# Patient Record
Sex: Female | Born: 2000 | Race: White | Hispanic: No | Marital: Single | Smoking: Never smoker
Health system: Southern US, Community
[De-identification: ages and names within clinical notes are randomized; demographics above are authoritative.]

## PROBLEM LIST (undated history)

## (undated) DIAGNOSIS — F32A Depression, unspecified: Secondary | ICD-10-CM

## (undated) DIAGNOSIS — R079 Chest pain, unspecified: Secondary | ICD-10-CM

## (undated) DIAGNOSIS — D649 Anemia, unspecified: Secondary | ICD-10-CM

## (undated) DIAGNOSIS — E282 Polycystic ovarian syndrome: Secondary | ICD-10-CM

## (undated) DIAGNOSIS — F419 Anxiety disorder, unspecified: Secondary | ICD-10-CM

## (undated) DIAGNOSIS — R7303 Prediabetes: Secondary | ICD-10-CM

## (undated) DIAGNOSIS — M549 Dorsalgia, unspecified: Secondary | ICD-10-CM

## (undated) DIAGNOSIS — R0602 Shortness of breath: Secondary | ICD-10-CM

## (undated) DIAGNOSIS — I10 Essential (primary) hypertension: Secondary | ICD-10-CM

## (undated) HISTORY — DX: Anemia, unspecified: D64.9

## (undated) HISTORY — DX: Prediabetes: R73.03

## (undated) HISTORY — DX: Essential (primary) hypertension: I10

## (undated) HISTORY — DX: Dorsalgia, unspecified: M54.9

## (undated) HISTORY — DX: Anxiety disorder, unspecified: F41.9

## (undated) HISTORY — DX: Polycystic ovarian syndrome: E28.2

## (undated) HISTORY — DX: Depression, unspecified: F32.A

## (undated) HISTORY — DX: Chest pain, unspecified: R07.9

## (undated) HISTORY — DX: Shortness of breath: R06.02

## (undated) NOTE — *Deleted (*Deleted)
I value your feedback and entrusting us with your care. If you get a [facility] patient survey, I would appreciate you taking the time to let us know about your experience today. Thank you!  As of December 12, 2018, your lab results will be released to your MyChart immediately, before I even have a chance to see them. Please give me time to review them and contact you if there are any abnormalities. Thank you for your patience.  

---
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
## 2021-06-28 ENCOUNTER — Encounter: Payer: Self-pay | Admitting: *Deleted

## 2021-06-28 ENCOUNTER — Emergency Department
Admission: EM | Admit: 2021-06-28 | Discharge: 2021-06-29 | Disposition: A | Discharge status: Home / Self Care | Payer: Medicaid Other | Attending: Emergency Medicine | Admitting: Emergency Medicine

## 2021-06-28 ENCOUNTER — Other Ambulatory Visit: Payer: Self-pay

## 2021-06-28 DIAGNOSIS — N939 Abnormal uterine and vaginal bleeding, unspecified: Secondary | ICD-10-CM | POA: Diagnosis present

## 2021-06-28 MED ORDER — IBUPROFEN 800 MG PO TABS
800.0000 mg | ORAL_TABLET | Freq: Once | ORAL | Status: AC
  Administered 2021-06-28: 800 mg via ORAL
  Filled 2021-06-28: qty 1

## 2021-06-29 LAB — POC URINE PREG, ED: Preg Test, Ur: NEGATIVE

## 2021-06-29 MED ORDER — NORGESTIMATE-ETH ESTRADIOL 0.25-35 MG-MCG PO TABS: 1.0000 | ORAL_TABLET | Freq: Every day | ORAL | 0 refills | Status: DC

## 2021-06-29 NOTE — Discharge Instructions (Addendum)
You may alternate Tylenol 1000 mg every 6 hours as needed for pain, fever and Ibuprofen 800 mg every 6-8 hours as needed for pain, fever.  Please take Ibuprofen with food.  Do not take more than 4000 mg of Tylenol (acetaminophen) in a 24 hour period.  Steps to find a Primary Care Provider (PCP):  Call [phone] or [phone] to access "[facility] Find a Doctor Service."  2.  You may also go on the [facility] website at [email or web address]  

## 2021-06-29 NOTE — ED Provider Notes (Signed)
[facility] Provider Note    Event Date/Time   First MD Initiated Contact with Patient 06/28/21 2259     (approximate)   History   Vaginal Bleeding   HPI  Sonya Knapp is a 21 y.o. female with history of irregular menstrual cycles who presents to the emergency department with vaginal bleeding that has been ongoing for about a month and a half.  States it has been heavier and more painful over the past several days and she has had to miss 2 days of work.  She states that she recently lost her insurance and so has not followed up with anybody as an outpatient for this.  She denies any fevers, vomiting, diarrhea, dysuria, vaginal discharge or odor.  She states that when she has had heavy menstrual cycles before she has been on birth control pills which has helped and is asking for a prescription today.  States she also had to miss 2 days of work due to the heavy bleeding and is requesting a work note.  She declines any other work-up including blood work, ultrasound.  She states that when she has had heavy bleeding before she has had blood work that was normal.   History provided by patient.    No past medical history on file.  No past surgical history on file.  MEDICATIONS:  Prior to Admission medications   Medication Sig Start Date End Date Taking? Authorizing Provider  amoxicillin (AMOXIL) 500 MG capsule Take 2 capsules (1,000 mg total) by mouth 3 (three) times daily. 11/08/19   Jene Every, MD    Physical Exam   Triage Vital Signs: ED Triage Vitals  Enc Vitals Group     BP 06/28/21 2100 (!) 150/77     Pulse Rate 06/28/21 2058 79     Resp 06/28/21 2058 18     Temp 06/28/21 2058 98.6 F (37 C)     Temp Source 06/28/21 2058 Oral     SpO2 06/28/21 2058 99 %     Weight 06/28/21 2056 (!) 360 lb (163.3 kg)     Height 06/28/21 2056 5\' 6"  (1.676 m)     Head Circumference --      Peak Flow --      Pain Score 06/28/21 2056 6     Pain Loc --      Pain  Edu? --      Excl. in GC? --     Most recent vital signs: Vitals:   06/28/21 2100 06/28/21 2355  BP: (!) 150/77 (!) 134/99  Pulse:  78  Resp:  16  Temp:    SpO2:  100%    CONSTITUTIONAL: Alert and oriented and responds appropriately to questions. Well-appearing; well-nourished HEAD: Normocephalic, atraumatic EYES: Conjunctivae clear, pupils appear equal, sclera nonicteric ENT: normal nose; moist mucous membranes NECK: Supple, normal ROM CARD: RRR; S1 and S2 appreciated; no murmurs, no clicks, no rubs, no gallops RESP: Normal chest excursion without splinting or tachypnea; breath sounds clear and equal bilaterally; no wheezes, no rhonchi, no rales, no hypoxia or respiratory distress, speaking full sentences ABD/GI: Normal bowel sounds; non-distended; soft, non-tender, no rebound, no guarding, no peritoneal signs BACK: The back appears normal EXT: Normal ROM in all joints; no deformity noted, no edema; no cyanosis SKIN: Normal color for age and race; warm; no rash on exposed skin NEURO: Moves all extremities equally, normal speech PSYCH: The patient's mood and manner are appropriate.   ED Results / Procedures / Treatments  LABS: (all labs ordered are listed, but only abnormal results are displayed) Labs Reviewed  POC URINE PREG, ED     EKG:   RADIOLOGY: My personal review and interpretation of imaging:    I have personally reviewed all radiology reports.   No results found.   PROCEDURES:  Critical Care performed: No  studies, pulse oximetry and re-evaluation of patient's condition.   Procedures    IMPRESSION / MDM / ASSESSMENT AND PLAN / ED COURSE  I reviewed the triage vital signs and the nursing notes.    Patient here with dysfunctional uterine bleeding.  States this is a chronic issue for her.  She has been bleeding for about a month and a half.  No systemic symptoms.  Here asking for birth control.     DIFFERENTIAL DIAGNOSIS (includes but not  limited to):   Dysfunctional uterine bleeding, uterine fibroids, endometriosis, less likely ovarian cyst, ovarian torsion, TOA, PID   Patient's presentation is most consistent with acute presentation with potential threat to life or bodily function.   PLAN: Have offered blood work today to check hemoglobin, platelets, coags to ensure she does not need transfusion or further work-up for her dysfunctional uterine bleeding but she declined stating that she has had blood work multiple times in the past when she has had heavy bleeding that she reports has been normal.  She declines pelvic exam, ultrasound.  States that she just wants to be put back on birth control and needs a work note for missing work for the past 2 days.  She denies being a smoker.  She does not have an OB/GYN for follow-up.  She agrees to a pregnancy test and ibuprofen for pain.   MEDICATIONS GIVEN IN ED: Medications  ibuprofen (ADVIL) tablet 800 mg (800 mg Oral Given 06/28/21 2354)     ED COURSE: Pregnancy test negative.  Will discharge with 1 month prescription of Sprintec but have encouraged her to establish care with a PCP, OB/GYN for further follow-up.  Discussed bleeding return precautions.   CONSULTS: No admission needed at this time.  Patient hemodynamically stable.  Refusing further work-up including labs.   OUTSIDE RECORDS REVIEWED: Reviewed patient's previous OB/GYN note with Gevena Barre on 01/22/2020.       FINAL CLINICAL IMPRESSION(S) / ED DIAGNOSES   Final diagnoses:  Abnormal uterine bleeding (AUB)     Rx / DC Orders   ED Discharge Orders          Ordered    norgestimate-ethinyl estradiol (SPRINTEC 28) 0.25-35 MG-MCG tablet  Daily        06/29/21 0011             Note:  This document was prepared using Dragon voice recognition software and may include unintentional dictation errors.   Jaivian Battaglini, Layla Maw, DO 06/29/21 0020

## 2021-12-15 IMAGING — CT CT ANGIO CHEST
2 of 6 series · 17 of 46 positions shown · IV contrast (APPLIED)
Comparison: Chest radiograph 11/05/2019

CLINICAL DATA: Pt reports intermittent sharp chest pain to the
center of her chest for the last couple of weeks. Pt reports some
SOB with the pain but denies all other sx's.

EXAM:
CT ANGIOGRAPHY CHEST WITH CONTRAST
TECHNIQUE: Multidetector CT imaging of the chest was performed using the
standard protocol during bolus administration of intravenous
contrast. Multiplanar CT image reconstructions and MIPs were
obtained to evaluate the vascular anatomy.
CONTRAST:  100mL OMNIPAQUE IOHEXOL 350 MG/ML SOLN

[Series 8: thins · axial · 0.88mm/px · z∈[-624,-356]mm · 14 of 294 slices shown]
[im 13/294  lung]
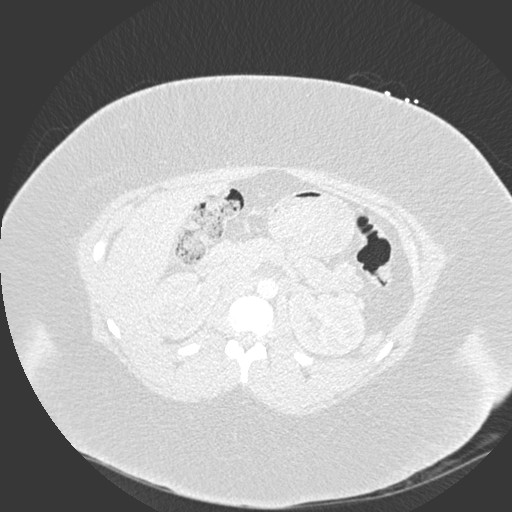
[im 39/294  soft-tissue]
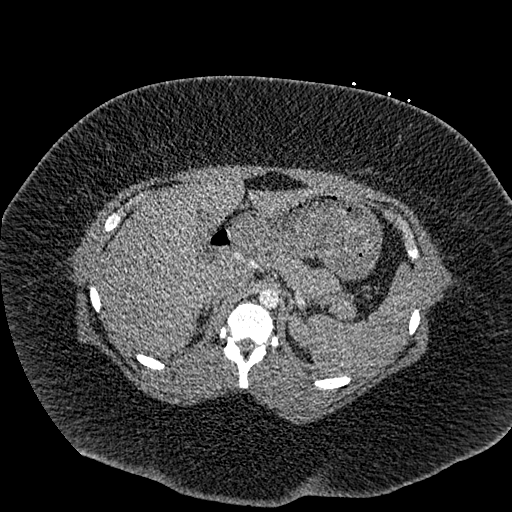
[im 51/294  lung]
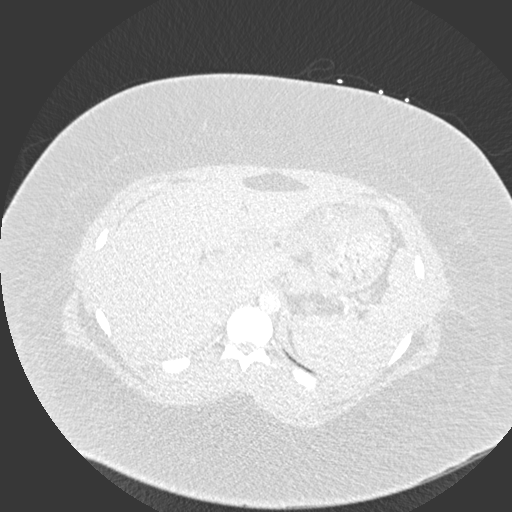
[im 77/294  soft-tissue]
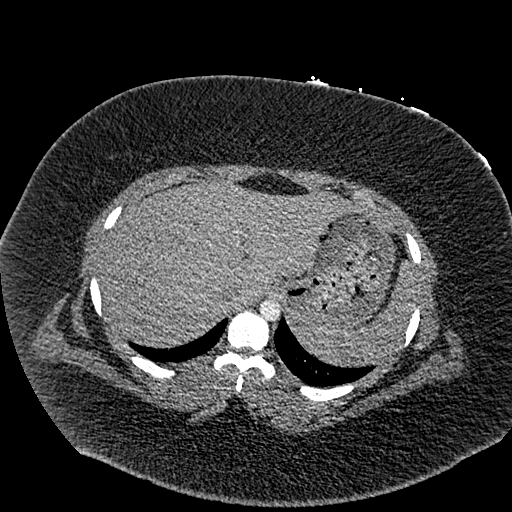
[im 102/294  lung]
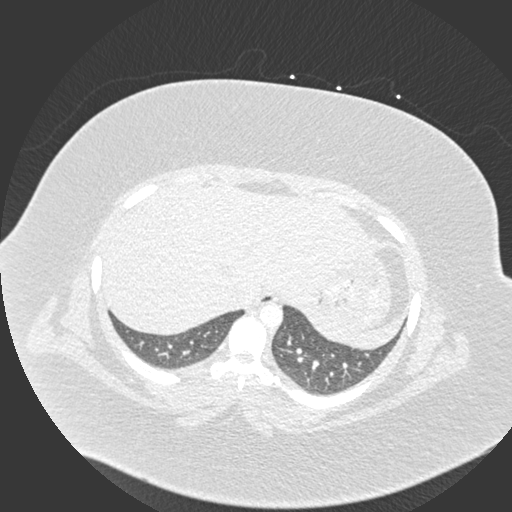
[im 115/294  soft-tissue]
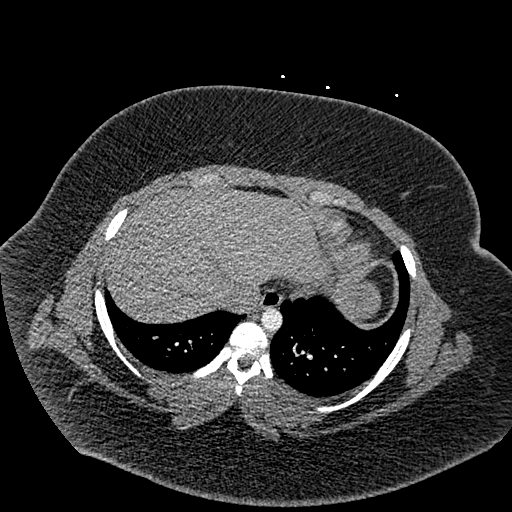
[im 141/294  lung]
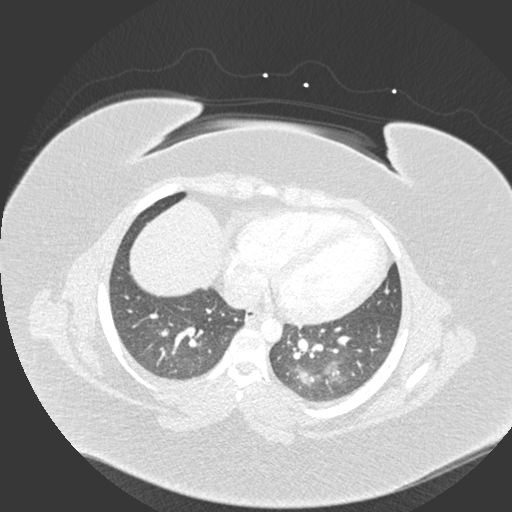
[im 153/294  soft-tissue]
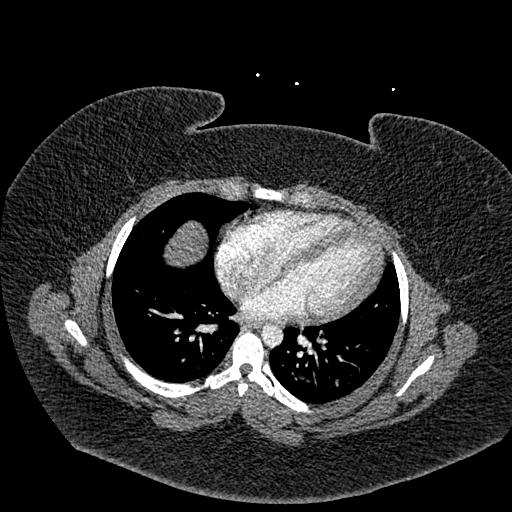
[im 179/294  lung]
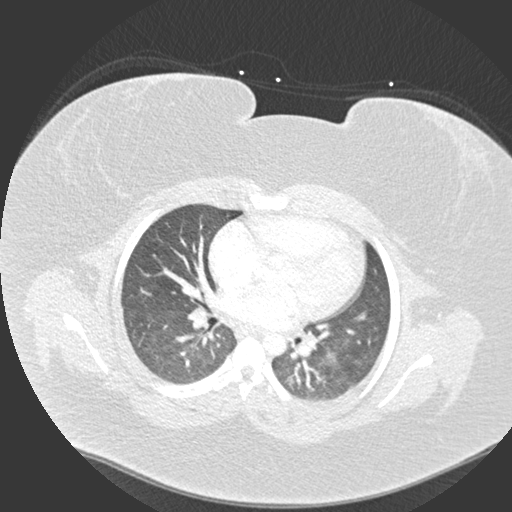
[im 192/294  soft-tissue]
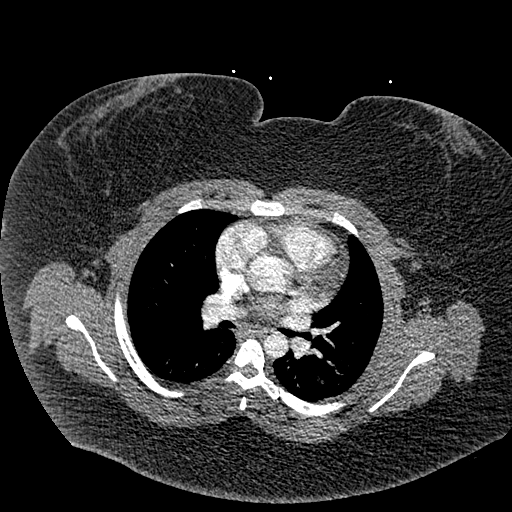
[im 217/294  lung]
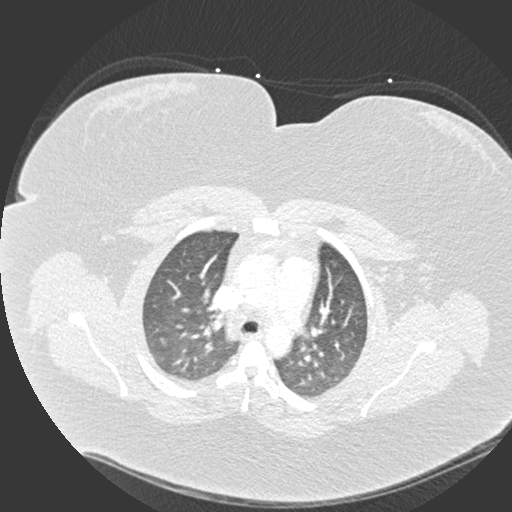
[im 243/294  soft-tissue]
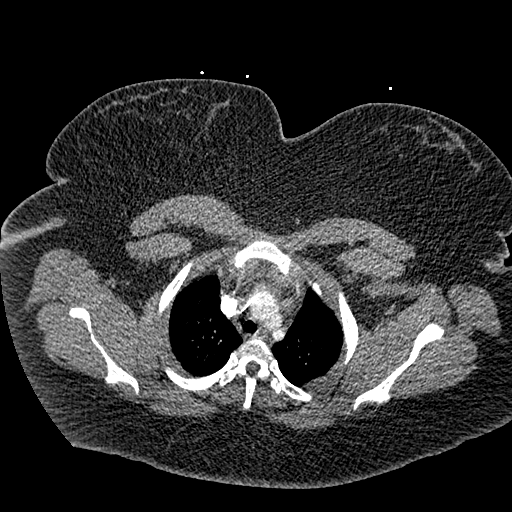
[im 255/294  lung]
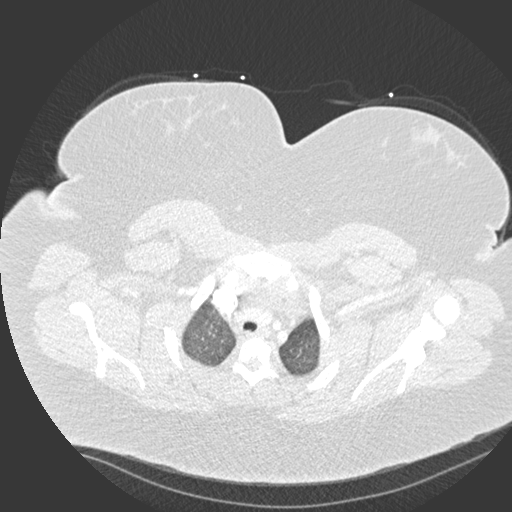
[im 281/294  soft-tissue]
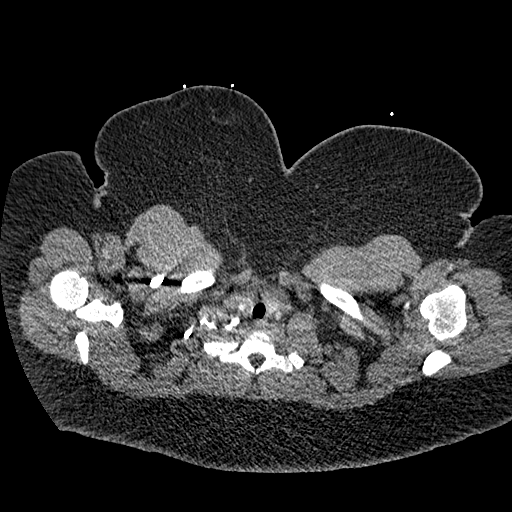

[Series 10: coronal mpr · coronal · 0.59mm/px · 3 of 115 slices shown]
[im 29/115  soft-tissue]
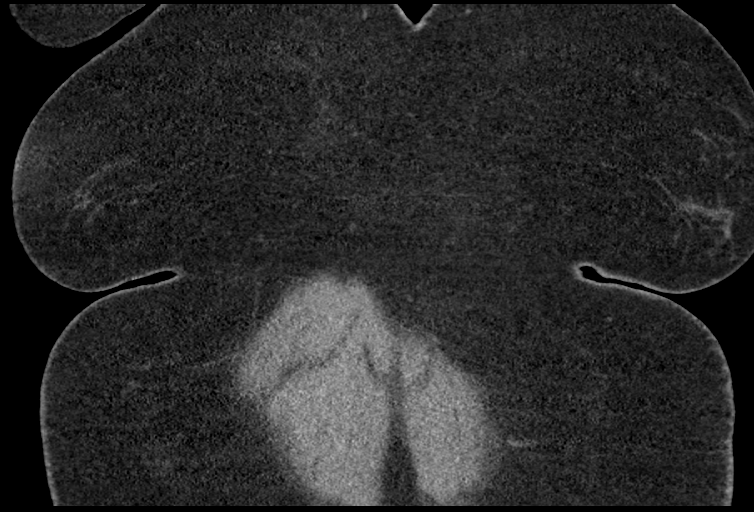
[im 58/115  soft-tissue]
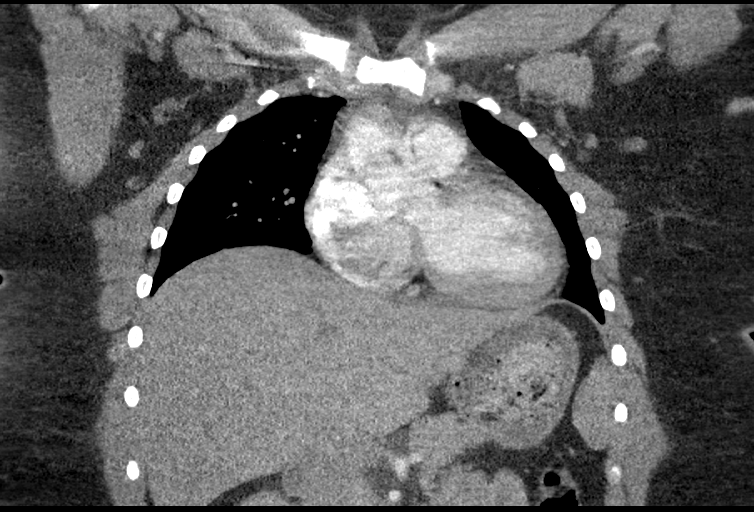
[im 86/115  soft-tissue]
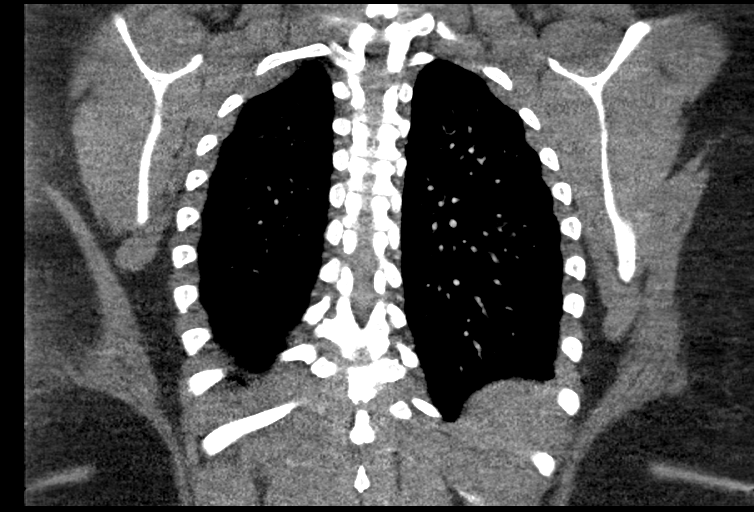

[17 of 46 positions shown; findings below may reference images not displayed]

FINDINGS: Cardiovascular: Poor contrast opacification and patient body habitus
significantly limits evaluation of the pulmonary arteries. No
definite large central thrombus. Normal heart size. No pericardial
effusion. Normal caliber of the thoracic aorta. Some

Mediastinum/Nodes: No enlarged mediastinal, hilar, or axillary lymph
nodes. Thyroid gland, trachea, and esophagus demonstrate no
significant findings.

Lungs/Pleura: Central airways are patent. There are multifocal small
clustered ground-glass opacities in the left greater than right
lower lobes. No large consolidation. No pneumothorax or pleural
effusion.

Upper Abdomen: No acute abnormality.

Musculoskeletal: No chest wall abnormality. No acute or significant
osseous findings.

Review of the MIP images confirms the above findings.
IMPRESSION: 1. Poor contrast opacification and patient body habitus
significantly limits evaluation of the pulmonary arteries. No
definite large central thrombus.
2. Multifocal clustered ground-glass opacities in the left greater
than right lower lobes, suspicious for multifocal infectious versus
inflammatory process.

## 2022-07-03 ENCOUNTER — Ambulatory Visit (INDEPENDENT_AMBULATORY_CARE_PROVIDER_SITE_OTHER): Payer: 59 | Admitting: Family Medicine

## 2022-07-03 ENCOUNTER — Encounter (INDEPENDENT_AMBULATORY_CARE_PROVIDER_SITE_OTHER): Payer: Self-pay | Admitting: Family Medicine

## 2022-07-03 VITALS — BP 124/82 | HR 80 | Temp 98.1°F | Ht 66.0 in | Wt 384.0 lb

## 2022-07-03 DIAGNOSIS — R7303 Prediabetes: Secondary | ICD-10-CM | POA: Diagnosis not present

## 2022-07-03 DIAGNOSIS — Z0289 Encounter for other administrative examinations: Secondary | ICD-10-CM

## 2022-07-03 DIAGNOSIS — E669 Obesity, unspecified: Secondary | ICD-10-CM

## 2022-07-03 DIAGNOSIS — Z6841 Body Mass Index (BMI) 40.0 and over, adult: Secondary | ICD-10-CM | POA: Insufficient documentation

## 2022-07-03 NOTE — Progress Notes (Unsigned)
Office: [phone]  /  Fax: [phone]   Initial Visit  Sonya Knapp was seen in clinic today to evaluate for obesity. She is interested in losing weight to improve overall health and reduce the risk of weight related complications. She presents today to review program treatment options, initial physical assessment, and evaluation.     She was referred by: Self-Referral  When asked what else they would like to accomplish? She states: Adopt healthier eating patterns and Improve quality of life  When asked how has your weight affected you? She states: Contributed to medical problems  Some associated conditions: Prediabetes, GERD, and PCOS  Current nutrition plan: None and Other: decrease soda  Current or previous pharmacotherapy: Metformin  Response to medication: Had side effects so it was discontinued   Past medical history includes:  History reviewed. No pertinent past medical history.   Objective:   BP 124/82   Pulse 80   Temp 98.1 F (36.7 C)   Ht 5\' 6"  (1.676 m)   Wt (!) 384 lb (174.2 kg)   SpO2 97%   BMI 61.98 kg/m  She was weighed on the bioimpedance scale: Body mass index is 61.98 kg/m.  Peak Weight: 380 lbs ,Visceral Fat Rating:21, Body Fat%:54.6  General:  Alert, oriented and cooperative. Patient is in no acute distress.  Respiratory: Normal respiratory effort, no problems with respiration noted  Extremities: Normal range of motion.    Mental Status: Normal mood and affect. Normal behavior. Normal judgment and thought content.   Assessment and Plan:  1. Prediabetes Patient has an elevated A1c above 6.0. She had GI upset with metformin in the past.  Plan: Patient agreed to start weight loss to help decrease the risk of diabetes mellitus. We will schedule her for fasting labs and additional testing.   2. Obesity, Beginning BMI 62.0  3. Class 3 severe obesity with serious comorbidity and body mass index (BMI) of 60.0 to 69.9 in adult, unspecified obesity  type (HCC) We reviewed weight, biometrics, associated medical conditions and contributing factors with patient. She would benefit from weight loss therapy via a modified calorie, low-carb, high-protein nutritional plan tailored to their REE (resting energy expenditure) which will be determined by indirect calorimetry.  We will also assess for cardiometabolic risk and nutritional derangements via fasting serologies at her next appointment.      Obesity Treatment / Action Plan:  Patient will work on garnering support from family and friends to begin weight loss journey. Will complete provided nutritional and psychosocial assessment questionnaire before the next appointment. Will be scheduled for indirect calorimetry to determine resting energy expenditure in a fasting state.  This will allow [place] to create a reduced calorie, high-protein meal plan to promote loss of fat mass while preserving muscle mass. Will reduce liquid calories and sugary drinks from diet. Was counseled on nutritional approaches to weight loss and benefits of reducing processed foods and consuming plant-based foods and high quality protein as part of nutritional weight management.  Obesity Education Performed Today:  She was weighed on the bioimpedance scale and results were discussed and documented in the synopsis.  We discussed obesity as a disease and the importance of a more detailed evaluation of all the factors contributing to the disease.  We discussed the importance of long term lifestyle changes which include nutrition, exercise and behavioral modifications as well as the importance of customizing this to her specific health and social needs.  We discussed the benefits of reaching a healthier weight to  alleviate the symptoms of existing conditions and reduce the risks of the biomechanical, metabolic and psychological effects of obesity.  Sonya Knapp appears to be in the action stage of change and states they are ready  to start intensive lifestyle modifications and behavioral modifications.  I have personally spent 45 minutes total time today in preparation, patient care, and documentation for this visit, including the following: review of clinical lab tests; review of medical tests/procedures/services.  Reviewed by clinician on day of visit: allergies, medications, problem list, medical history, surgical history, family history, social history, and previous encounter notes.   I, Burt Knack, am acting as transcriptionist for Quillian Quince, MD   I have reviewed the above documentation for accuracy and completeness, and I agree with the above. Quillian Quince, MD

## 2022-07-17 ENCOUNTER — Encounter (INDEPENDENT_AMBULATORY_CARE_PROVIDER_SITE_OTHER): Payer: Self-pay | Admitting: Family Medicine

## 2022-07-17 ENCOUNTER — Ambulatory Visit (INDEPENDENT_AMBULATORY_CARE_PROVIDER_SITE_OTHER): Payer: 59 | Admitting: Family Medicine

## 2022-07-17 VITALS — BP 121/79 | HR 79 | Temp 97.9°F | Ht 66.0 in | Wt 388.0 lb

## 2022-07-17 DIAGNOSIS — D6489 Other specified anemias: Secondary | ICD-10-CM | POA: Diagnosis not present

## 2022-07-17 DIAGNOSIS — R7303 Prediabetes: Secondary | ICD-10-CM | POA: Diagnosis not present

## 2022-07-17 DIAGNOSIS — R5383 Other fatigue: Secondary | ICD-10-CM | POA: Diagnosis not present

## 2022-07-17 DIAGNOSIS — F32A Depression, unspecified: Secondary | ICD-10-CM

## 2022-07-17 DIAGNOSIS — E669 Obesity, unspecified: Secondary | ICD-10-CM

## 2022-07-17 DIAGNOSIS — R0602 Shortness of breath: Secondary | ICD-10-CM

## 2022-07-17 DIAGNOSIS — Z6841 Body Mass Index (BMI) 40.0 and over, adult: Secondary | ICD-10-CM

## 2022-07-17 DIAGNOSIS — D649 Anemia, unspecified: Secondary | ICD-10-CM | POA: Insufficient documentation

## 2022-07-17 DIAGNOSIS — Z1331 Encounter for screening for depression: Secondary | ICD-10-CM | POA: Insufficient documentation

## 2022-07-18 NOTE — Progress Notes (Signed)
Chief Complaint:   OBESITY Sonya Knapp (MR# [id number]) is a 22 y.o. female who presents for evaluation and treatment of obesity and related comorbidities. Current BMI is Body mass index is 62.62 kg/m. Sonya Knapp has been struggling with her weight for many years and has been unsuccessful in either losing weight, maintaining weight loss, or reaching her healthy weight goal.  Sonya Knapp is currently in the action stage of change and ready to dedicate time achieving and maintaining a healthier weight. Sonya Knapp is interested in becoming our patient and working on intensive lifestyle modifications including (but not limited to) diet and exercise for weight loss.  Sonya Knapp's habits were reviewed today and are as follows: her desired weight loss is 188 lbs, she has been heavy most of her life, she started gaining weight over COVID, her heaviest weight ever was 381 pounds, she has significant food cravings issues, she snacks frequently in the evenings, she skips meals frequently, she is frequently drinking liquids with calories, she frequently makes poor food choices, she has problems with excessive hunger, she frequently eats larger portions than normal, she has binge eating behaviors, and she struggles with emotional eating.  Depression Screen Bruchy's Food and Mood (modified PHQ-9) score was 26.  Subjective:   1. Other fatigue Sonya Knapp admits to daytime somnolence and admits to waking up still tired. Patient has a history of symptoms of daytime fatigue, morning fatigue, and morning headache. Sonya Knapp generally gets 6 or 8 hours of sleep per night, and states that she has nightime awakenings and generally restful sleep. Snoring is present. Apneic episodes are not present. Epworth Sleepiness Score is 8.   2. SOBOE (shortness of breath on exertion) Sonya Knapp notes increasing shortness of breath with exercising and seems to be worsening over time with weight gain. She notes getting out of breath sooner with activity than  she used to. This has not gotten worse recently. Sonya Knapp denies shortness of breath at rest or orthopnea.  3. Prediabetes Patient has a history of elevated glucose and A1c.  She is ready to work on her diet to help prevent diabetes mellitus.  4. Anemia due to other cause, not classified Patient's diagnosis is due to heavy menses. She is not on supplementation and notes fatigue.  5. Depression screening Patient notes significant emotional eating behavior and she feels she has both anxiety and depression.  She denies suicidal ideations.  Assessment/Plan:   1. Other fatigue Sonya Knapp does feel that her weight is causing her energy to be lower than it should be. Fatigue may be related to obesity, depression or many other causes. Labs will be ordered, and in the meanwhile, Sonya Knapp will focus on self care including making healthy food choices, increasing physical activity and focusing on stress reduction.  - EKG 12-Lead - CBC with Differential/Platelet - CMP14+EGFR - Lipid Panel With LDL/HDL Ratio - TSH - VITAMIN D 25 Hydroxy (Vit-D Deficiency, Fractures) - Folate  2. SOBOE (shortness of breath on exertion) Sonya Knapp does feel that she gets out of breath more easily that she used to when she exercises. Sonya Knapp's shortness of breath appears to be obesity related and exercise induced. She has agreed to work on weight loss and gradually increase exercise to treat her exercise induced shortness of breath. Will continue to monitor closely.  - EKG 12-Lead  3. Prediabetes We will check labs today.  Patient will start on her category 3 eating plan, and we will follow-up at her next visit.  - Insulin, random - Hemoglobin  A1c  4. Anemia due to other cause, not classified We will check labs today, and we will follow-up at patient's next visit.  - Folate - Iron and TIBC - Ferritin  5. Depression screening Sonya Knapp had a positive depression screening. Depression is commonly associated with obesity and often  results in emotional eating behaviors. We will monitor this closely and work on CBT to help improve the non-hunger eating patterns. Referral to [name], our bariatric psychologist for evaluation, and patient will work on decreasing emotional eating behavior.  6. BMI 60.0-69.9, adult (HCC)  7. Obesity, Beginning BMI 62.0 [name] is currently in the action stage of change and her goal is to continue with weight loss efforts. I recommend [name] begin the structured treatment plan as follows:  She has agreed to the Category 3 Plan.  Exercise goals: No exercise has been prescribed for now, while we concentrate on nutritional changes.   Behavioral modification strategies: decreasing eating out, better snacking choices, and dealing with family or coworker sabotage.  She was informed of the importance of frequent follow-up visits to maximize her success with intensive lifestyle modifications for her multiple health conditions. She was informed we would discuss her lab results at her next visit unless there is a critical issue that needs to be addressed sooner. Sonya Knapp agreed to keep her next visit at the agreed upon time to discuss these results.  Objective:   Blood pressure 121/79, pulse 79, temperature 97.9 F (36.6 C), height 5\' 6"  (1.676 m), weight (!) 388 lb (176 kg), SpO2 99%. Body mass index is 62.62 kg/m.  EKG: Normal sinus rhythm, rate 79 BPM.  Indirect Calorimeter completed today shows a VO2 of 329 and a REE of 2275.  Her calculated basal metabolic rate is 8469 thus her basal metabolic rate is worse than expected.  General: Cooperative, alert, well developed, in no acute distress. HEENT: Conjunctivae and lids unremarkable. Cardiovascular: Regular rhythm.  Lungs: Normal work of breathing. Neurologic: No focal deficits.   Lab Results  Component Value Date   CREATININE 0.81 11/08/2019   BUN 11 11/08/2019   NA 140 11/08/2019   K 3.7 11/08/2019   CL 106 11/08/2019   CO2 26 11/08/2019    No results found for: "ALT", "AST", "GGT", "ALKPHOS", "BILITOT" No results found for: "HGBA1C" No results found for: "INSULIN" No results found for: "TSH" No results found for: "CHOL", "HDL", "LDLCALC", "LDLDIRECT", "TRIG", "CHOLHDL" Lab Results  Component Value Date   WBC 6.5 11/08/2019   HGB 8.1 (L) 11/08/2019   HCT 28.2 (L) 11/08/2019   MCV 66.5 (L) 11/08/2019   PLT 466 (H) 11/08/2019   No results found for: "IRON", "TIBC", "FERRITIN"  Attestation Statements:   Reviewed by clinician on day of visit: allergies, medications, problem list, medical history, surgical history, family history, social history, and previous encounter notes.  Time spent on visit including pre-visit chart review and post-visit charting and care was 45 minutes.   I, Burt Knack, am acting as transcriptionist for Quillian Quince, MD.  I have reviewed the above documentation for accuracy and completeness, and I agree with the above. - Quillian Quince, MD

## 2022-07-31 ENCOUNTER — Ambulatory Visit (INDEPENDENT_AMBULATORY_CARE_PROVIDER_SITE_OTHER): Payer: 59 | Admitting: Family Medicine

## 2022-08-09 DIAGNOSIS — R5383 Other fatigue: Secondary | ICD-10-CM | POA: Diagnosis not present

## 2022-08-15 ENCOUNTER — Telehealth (INDEPENDENT_AMBULATORY_CARE_PROVIDER_SITE_OTHER): Payer: 59 | Admitting: Psychology

## 2022-08-15 DIAGNOSIS — F411 Generalized anxiety disorder: Secondary | ICD-10-CM | POA: Diagnosis not present

## 2022-08-15 DIAGNOSIS — F5089 Other specified eating disorder: Secondary | ICD-10-CM

## 2022-08-15 DIAGNOSIS — F331 Major depressive disorder, recurrent, moderate: Secondary | ICD-10-CM
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
## 2022-08-28 ENCOUNTER — Encounter (INDEPENDENT_AMBULATORY_CARE_PROVIDER_SITE_OTHER): Payer: Self-pay | Admitting: Family Medicine

## 2022-08-28 ENCOUNTER — Ambulatory Visit (INDEPENDENT_AMBULATORY_CARE_PROVIDER_SITE_OTHER): Payer: 59 | Admitting: Family Medicine

## 2022-08-28 VITALS — BP 122/80 | HR 80 | Temp 97.8°F | Ht 66.0 in | Wt 391.0 lb

## 2022-08-28 DIAGNOSIS — R7303 Prediabetes: Secondary | ICD-10-CM

## 2022-08-28 DIAGNOSIS — E669 Obesity, unspecified: Secondary | ICD-10-CM

## 2022-08-28 DIAGNOSIS — Z6841 Body Mass Index (BMI) 40.0 and over, adult: Secondary | ICD-10-CM | POA: Diagnosis not present

## 2022-08-28 DIAGNOSIS — E559 Vitamin D deficiency, unspecified: Secondary | ICD-10-CM

## 2022-08-28 MED ORDER — VITAMIN D (ERGOCALCIFEROL) 1.25 MG (50000 UNIT) PO CAPS: 50000.0000 [IU] | ORAL_CAPSULE | ORAL | 0 refills | Status: DC

## 2022-08-28 MED ORDER — METFORMIN HCL 500 MG PO TABS
500.0000 mg | ORAL_TABLET | Freq: Every day | ORAL | 0 refills | Status: DC
Start: 2022-08-28 — End: 2022-09-18

## 2022-08-29 NOTE — Progress Notes (Unsigned)
Chief Complaint:   OBESITY Sonya Knapp is here to discuss her progress with her obesity treatment plan along with follow-up of her obesity related diagnoses. Sonya Knapp is on the Category 3 Plan and states she is following her eating plan approximately 25% of the time. Sonya Knapp states she has done some walking.  Today's visit was #: 2 Starting weight: 388 lbs Starting date: 07/17/2022 Today's weight: 391 lbs Today's date: 08/28/2022 Total lbs lost to date: 0 Total lbs lost since last in-office visit: 0  Interim History: Patient's last visit was approximately 6 weeks ago and she has not been able to follow her plan closely.  She is ready to restart and she is open to other eating plans.  Subjective:   1. Prediabetes Patient has an elevated A1c and very elevated fasting insulin.  She notes polyphagia. I discussed labs with the patient today.  2. Vitamin D deficiency Patient's vitamin D level is below goal and her Ca+ is also low, and she notes fatigue.  I discussed labs with the patient today.  Assessment/Plan:   1. Prediabetes Patient agreed to start metformin 500 mg every morning with food, with no refills.  We will follow-up at her next visit in 2 weeks.  - metFORMIN (GLUCOPHAGE) 500 MG tablet; Take 1 tablet (500 mg total) by mouth daily with breakfast.  Dispense: 30 tablet; Refill: 0  2. Vitamin D deficiency Patient agreed to start prescription vitamin D 50,000 IU once weekly with no refills.  - Vitamin D, Ergocalciferol, (DRISDOL) 1.25 MG (50000 UNIT) CAPS capsule; Take 1 capsule (50,000 Units total) by mouth every 7 (seven) days.  Dispense: 5 capsule; Refill: 0  3. BMI 60.0-69.9, adult (HCC)  4. Obesity, Beginning BMI 62.0 Sonya Knapp is currently in the action stage of change. As such, her goal is to continue with weight loss efforts. She has agreed to keeping a food journal and adhering to recommended goals of 1600-1800 calories and 100+ grams of protein daily.   Behavioral  modification strategies: increasing lean protein intake, decreasing simple carbohydrates, and keeping a strict food journal.  Sonya Knapp has agreed to follow-up with our clinic in 2 weeks. She was informed of the importance of frequent follow-up visits to maximize her success with intensive lifestyle modifications for her multiple health conditions.   Objective:   Blood pressure 122/80, pulse 80, temperature 97.8 F (36.6 C), height 5\' 6"  (1.676 m), weight (!) 391 lb (177.4 kg), SpO2 98%. Body mass index is 63.11 kg/m.  Lab Results  Component Value Date   CREATININE 0.82 08/09/2022   BUN 12 08/09/2022   NA 137 08/09/2022   K 4.4 08/09/2022   CL 102 08/09/2022   CO2 19 (L) 08/09/2022   Lab Results  Component Value Date   ALT 19 08/09/2022   AST 13 08/09/2022   ALKPHOS 83 08/09/2022   BILITOT 0.2 08/09/2022   Lab Results  Component Value Date   HGBA1C 6.1 (H) 08/09/2022   Lab Results  Component Value Date   INSULIN 106.0 (H) 08/09/2022   Lab Results  Component Value Date   TSH 3.890 08/09/2022   Lab Results  Component Value Date   CHOL 156 08/09/2022   HDL 53 08/09/2022   LDLCALC 81 08/09/2022   TRIG 127 08/09/2022   Lab Results  Component Value Date   VD25OH 28.0 (L) 08/09/2022   Lab Results  Component Value Date   WBC 6.4 08/09/2022   HGB 10.6 (L) 08/09/2022   HCT 34.7  08/09/2022   MCV 70 (L) 08/09/2022   PLT 413 08/09/2022   Lab Results  Component Value Date   IRON 105 08/09/2022   TIBC 448 08/09/2022   FERRITIN 19 08/09/2022   Attestation Statements:   Reviewed by clinician on day of visit: allergies, medications, problem list, medical history, surgical history, family history, social history, and previous encounter notes.  Time spent on visit including pre-visit chart review and post-visit care and charting was 40 minutes.   I, Burt Knack, am acting as transcriptionist for Quillian Quince, MD.  I have reviewed the above documentation for accuracy  and completeness, and I agree with the above. -  Quillian Quince, MD

## 2022-09-11 ENCOUNTER — Telehealth (INDEPENDENT_AMBULATORY_CARE_PROVIDER_SITE_OTHER): Payer: 59 | Admitting: Psychology

## 2022-09-11 DIAGNOSIS — F331 Major depressive disorder, recurrent, moderate: Secondary | ICD-10-CM

## 2022-09-11 DIAGNOSIS — F5089 Other specified eating disorder: Secondary | ICD-10-CM | POA: Diagnosis not present

## 2022-09-11 DIAGNOSIS — F411 Generalized anxiety disorder: Secondary | ICD-10-CM | POA: Diagnosis not present

## 2022-09-11 NOTE — Progress Notes (Signed)
  Office: [phone]  /  Fax: [phone]    Date: September 11, 2022  Appointment Start Time: 8:31am Duration: 25 minutes Provider: Lawerance Cruel, Psy.D. Type of Session: Individual Therapy  Location of Patient: Home (private location) Location of Provider: Provider's Home (private office) Type of Contact: Telepsychological Visit via MyChart Video Visit  Session Content: Sonya Knapp is a 22 y.o. female presenting for a follow-up appointment to address the previously established treatment goal of increasing coping skills.Today's appointment was a telepsychological visit. [name] provided verbal consent for today's telepsychological appointment and she is aware she is responsible for securing confidentiality on her end of the session. Prior to proceeding with today's appointment, [name]'s physical location at the time of this appointment was obtained as well a phone number she could be reached at in the event of technical difficulties. [name] and this provider participated in today's telepsychological service.   This provider conducted a brief check-in. [name] stated, "Things have been the same." She explained she did not reach out to any shared referral options due to feeling ambivalent. Further explored and processed. She agreed to call [facility] today. Regarding metformin, she noted she is taking it daily now. Positive reinforcement was provided. Reviewed recent hunger patterns. Psychoeducation regarding triggers for emotional eating was provided. [name] was provided a handout, and encouraged to utilize the handout between now and the next appointment to increase awareness of triggers and frequency. [name] agreed. This provider also discussed behavioral strategies for specific triggers, such as placing the utensil down when conversing to avoid mindless eating. Fumiko provided verbal consent during today's appointment for this provider to send a handout about triggers via e-mail. Overall, [name]  was receptive to today's appointment as evidenced by openness to sharing, responsiveness to feedback, and willingness to explore triggers for emotional eating.  Mental Status Examination:  Appearance: neat Behavior: appropriate to circumstances Mood: neutral Affect: mood congruent Speech: WNL Eye Contact: appropriate Psychomotor Activity: WNL Gait: unable to assess Thought Process: linear, logical, and goal directed and no evidence or endorsement of suicidal, homicidal, and self-harm ideation, plan and intent  Thought Content/Perception: no hallucinations, delusions, bizarre thinking or behavior endorsed or observed Orientation: AAOx4 Memory/Concentration: memory, attention, language, and fund of knowledge intact  Insight: fair Judgment: fair  Interventions:  Conducted a brief chart review Provided empathic reflections and validation Reviewed content from the previous session Provided positive reinforcement Employed supportive psychotherapy interventions to facilitate reduced distress and to improve coping skills with identified stressors Psychoeducation provided regarding triggers for emotional eating behaviors  DSM-5 Diagnosis(es):  F50.89 Other Specified Feeding or Eating Disorder, Emotional and Binge Eating Behaviors, F41.1 Generalized Anxiety Disorder, and F33.1  Major Depressive Disorder, Recurrent Episode, Moderate  Treatment Goal & Progress: During the initial appointment with this provider, the following treatment goal was established: increase coping skills. Progress is limited, as [name] has just begun treatment with this provider; however, she is receptive to the interaction and interventions and rapport is being established.   Plan: The next appointment is scheduled for 10/09/2022 at 8:30am, which will be via MyChart Video Visit. The next session will focus on working towards the established treatment goal. [name] will call [facility] to establish care. Phone  number was provided again during today's appointment.

## 2022-09-12 ENCOUNTER — Ambulatory Visit (INDEPENDENT_AMBULATORY_CARE_PROVIDER_SITE_OTHER): Payer: 59 | Admitting: Family Medicine

## 2022-09-18 ENCOUNTER — Ambulatory Visit (INDEPENDENT_AMBULATORY_CARE_PROVIDER_SITE_OTHER): Payer: 59 | Admitting: Family Medicine

## 2022-09-18 ENCOUNTER — Encounter (INDEPENDENT_AMBULATORY_CARE_PROVIDER_SITE_OTHER): Payer: Self-pay | Admitting: Family Medicine

## 2022-09-18 VITALS — BP 136/80 | HR 89 | Temp 97.9°F | Ht 66.0 in | Wt 395.0 lb

## 2022-09-18 DIAGNOSIS — E559 Vitamin D deficiency, unspecified: Secondary | ICD-10-CM

## 2022-09-18 DIAGNOSIS — Z6841 Body Mass Index (BMI) 40.0 and over, adult: Secondary | ICD-10-CM

## 2022-09-18 DIAGNOSIS — D6489 Other specified anemias: Secondary | ICD-10-CM | POA: Diagnosis not present

## 2022-09-18 DIAGNOSIS — R7303 Prediabetes: Secondary | ICD-10-CM

## 2022-09-18 DIAGNOSIS — D508 Other iron deficiency anemias: Secondary | ICD-10-CM

## 2022-09-18 MED ORDER — METFORMIN HCL 500 MG PO TABS
500.0000 mg | ORAL_TABLET | Freq: Two times a day (BID) | ORAL | 0 refills | Status: DC
Start: 2022-09-18 — End: 2023-10-22

## 2022-09-18 MED ORDER — IRON (FERROUS SULFATE) 325 (65 FE) MG PO TABS: ORAL_TABLET | ORAL | Status: DC

## 2022-09-18 MED ORDER — VITAMIN D (ERGOCALCIFEROL) 1.25 MG (50000 UNIT) PO CAPS
50000.0000 [IU] | ORAL_CAPSULE | ORAL | 0 refills | Status: DC
Start: 2022-09-18 — End: 2022-10-11

## 2022-09-18 NOTE — Progress Notes (Signed)
Sonya Knapp, D.O.  ABFM, ABOM Specializing in Clinical Bariatric Medicine  Office located at: [address] [place], [place]  [address]     Assessment and Plan:   Medications Discontinued During This Encounter  Medication Reason   metFORMIN (GLUCOPHAGE) 500 MG tablet Duplicate   metFORMIN (GLUCOPHAGE) 500 MG tablet Reorder   Vitamin D, Ergocalciferol, (DRISDOL) 1.25 MG (50000 UNIT) CAPS capsule Reorder    Meds ordered this encounter  Medications   Vitamin D, Ergocalciferol, (DRISDOL) 1.25 MG (50000 UNIT) CAPS capsule    Sig: Take 1 capsule (50,000 Units total) by mouth every 7 (seven) days.    Dispense:  5 capsule    Refill:  0   metFORMIN (GLUCOPHAGE) 500 MG tablet    Sig: Take 1 tablet (500 mg total) by mouth 2 (two) times daily with a meal. Lunch and dinner.    Dispense:  60 tablet    Refill:  0   Iron, Ferrous Sulfate, 325 (65 Fe) MG TABS    Sig: Pt takes 3 tabs QD   Anemia due to other cause, not classified Assessment: Condition is Not at goal.. Her hemoglobin is very low at 10.6. Her family has a history of anemia to the point where they have a partial hysterectomy by their mid 48's. Was seen at [facility] in the past when she was younger and was told that they had a genetic abnormality that causes anemia and is treated with oral iron. She takes oral iron supplement 65mg  of elemental iron TIB.  Lab Results  Component Value Date   WBC 6.4 08/09/2022   HGB 10.6 (L) 08/09/2022   HCT 34.7 08/09/2022   MCV 70 (L) 08/09/2022   PLT 413 08/09/2022      Component Value Date/Time   IRON 105 08/09/2022 1510   TIBC 448 08/09/2022 1510   FERRITIN 19 08/09/2022 1510   IRONPCTSAT 23 08/09/2022 1510   Plan: - Continue with Ferrous Sulfate as directed. I will refill this today.   - Her anemia appears to be microcytic hypochromic with a elevation in RDW which seems to be consistent with iron deficiency anemia.    Vitamin D deficiency Assessment: Condition is Not at goal..  She has been taking ERGO inconsistently for 3 weeks now. She denies any adverse side effects.  Lab Results  Component Value Date   VD25OH 28.0 (L) 08/09/2022   Plan:- Continue Ergocalciferol 50K IU weekly. I will refill this today.   - I discussed the importance of vitamin D to the patient's health and well-being as well as to their ability to lose weight.    - Informed patient this may be a lifelong thing, and she was encouraged to continue to take the medicine until told otherwise.     - weight loss will likely improve availability of vitamin D, thus encouraged Sonya Knapp to continue with meal plan and their weight loss efforts to further improve this condition.  Thus, we will need to monitor levels regularly (every 3-4 mo on average) to keep levels within normal limits and prevent over supplementation.   Prediabetes Assessment: Condition is Not at goal.. Her A1c and insulin levels are extremely elevated. She has been taking Metformin once daily for the past two weeks. She endorses no change on this and states that she "just takes it to take it". She denies any GI upset or N/V/D.  Lab Results  Component Value Date   HGBA1C 6.1 (H) 08/09/2022   INSULIN 106.0 (H) 08/09/2022  Plan: -  Increase her Metformin from once to twice daily with a meal.   - In addition, we discussed the risks and benefits of various medication options which can help [place] in the management of this disease process as well as with weight loss. However, I explained the risks of starting a GLP-1 without having the stability and habit of making healthy eating choices.  - Continue to decrease simple carbs/ sugars; increase fiber and proteins -> follow her meal plan.    - Explained role of simple carbs and insulin levels on hunger and cravings  - Anticipatory guidance given.    - We will recheck A1c and fasting insulin level in approximately 3 months from last check, or as deemed appropriate.    TREATMENT PLAN FOR  OBESITY: BMI 60.0-69.9, adult (HCC)- Current BMI-63.14 Class 3 severe obesity with serious comorbidity and body mass index (BMI) of 60.0 to 69.9 in adult, unspecified obesity type [facility]) Assessment:  Sonya Knapp is here to discuss her progress with her obesity treatment plan along with follow-up of her obesity related diagnoses. See Medical Weight Management Flowsheet for complete bioelectrical impedance results.  Condition is not optimized. Biometric data collected today, was reviewed with patient.   Since last office visit on 08/28/2022 patient's  Muscle mass has increased by 2.6lb. Fat mass has increased by 1lb. Total body water has been the same. Counseling done on how various foods will affect these numbers and how to maximize success  Total lbs lost to date: +7 Total weight loss percentage to date: +1.80   Plan: - the keeping a food journal and adhering to recommended goals of 1650-1750 calories and 120+ protein  - I advised patient to journal her daily intake so we distinguish what her good and bad eating habits are to be better assist her healthy weight journey. Looking at what she eats within a 2 week period will help [place] pinpoint which direction to place her in. I explained the purpose of GLP-1's and the risks of starting it.   - I recommended myfitnesspal, mynetdiary, and myfitnesspal as apps to use to track.   - I advised that chicken, beef, and lean red meats are the best options to eat.   - Discuss with Dr. Dalbert Garnet about pt work schedule.   Behavioral Intervention Additional resources provided today:  food log Evidence-based interventions for health behavior change were utilized today including the discussion of self monitoring techniques, problem-solving barriers and SMART goal setting techniques.   Regarding patient's less desirable eating habits and patterns, we employed the technique of small changes.  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     She has agreed to Think about ways to increase daily physical activity and overcoming barriers to exercise   FOLLOW UP: Return in about 3 weeks (around 10/09/2022).  She was informed of the importance of frequent follow up visits to maximize her success with intensive lifestyle modifications for her multiple health conditions.  Subjective:   Chief complaint: Obesity Sonya Knapp is here to discuss her progress with her obesity treatment plan. She is on the keeping a food journal and adhering to recommended goals of 1600-1800 calories and 100+ protein and states she is following her eating plan approximately 25% of the time. She states she is walking 10,000 steps 6 days per week.  Interval History:  Sonya Knapp is here for a follow up office visit.     Since last office visit:  She was previously seen by Dr. Dalbert Garnet and this is our first visit together. She states that she eats healthy choices but does not feel full and has to continue to eat. She follows the prescribed meal plan but occasional strays off plan but does not journal her food. Her mother explained that she was previously on Topiramate and this had no change for her.   We reviewed her meal plan and all questions were answered.   Review of Systems:  Pertinent positives were addressed with patient today.  Reviewed by clinician on day of visit: allergies, medications, problem list, medical history, surgical history, family history, social history, and previous encounter notes.  Weight Summary and Biometrics   Weight Lost Since Last Visit: 0  Weight Gained Since Last Visit: 4 lb   Vitals Temp: 97.9 F (36.6 C) BP: 136/80 Pulse Rate: 89 SpO2: 99 %   Anthropometric Measurements Height: 5\' 6"  (1.676 m) Weight: (!) 395 lb (179.2 kg) BMI (Calculated): 63.79 Weight at Last Visit: 391 lb Weight Lost Since Last Visit: 0 Weight Gained Since Last Visit: 4 lb Starting Weight: 388 lb Peak Weight: 388 lb   Body  Composition  Body Fat %: 55.9 % Fat Mass (lbs): 221 lbs Muscle Mass (lbs): 165.4 lbs Total Body Water (lbs): 122.6 lbs Visceral Fat Rating : 22   Other Clinical Data Fasting: no Labs: no Today's Visit #: 3 Starting Date: 07/17/22     Objective:   PHYSICAL EXAM: Blood pressure 136/80, pulse 89, temperature 97.9 F (36.6 C), height 5\' 6"  (1.676 m), weight (!) 395 lb (179.2 kg), SpO2 99%. Body mass index is 63.75 kg/m.  General: Well Developed, well nourished, and in no acute distress.  HEENT: Normocephalic, atraumatic Skin: Warm and dry, cap RF less 2 sec, good turgor Chest:  Normal excursion, shape, no gross abn Respiratory: speaking in full sentences, no conversational dyspnea NeuroM-Sk: Ambulates w/o assistance, moves * 4 Psych: A and O *3, insight good, mood-full  DIAGNOSTIC DATA REVIEWED:  BMET    Component Value Date/Time   NA 137 08/09/2022 1510   K 4.4 08/09/2022 1510   CL 102 08/09/2022 1510   CO2 19 (L) 08/09/2022 1510   GLUCOSE 97 08/09/2022 1510   GLUCOSE 84 11/08/2019 1241   BUN 12 08/09/2022 1510   CREATININE 0.82 08/09/2022 1510   CALCIUM 8.6 (L) 08/09/2022 1510   GFRNONAA >60 11/08/2019 1241   Lab Results  Component Value Date   HGBA1C 6.1 (H) 08/09/2022   Lab Results  Component Value Date   INSULIN 106.0 (H) 08/09/2022   Lab Results  Component Value Date   TSH 3.890 08/09/2022   CBC    Component Value Date/Time   WBC 6.4 08/09/2022 1510   WBC 6.5 11/08/2019 1241   RBC 4.94 08/09/2022 1510   RBC 4.24 11/08/2019 1241   HGB 10.6 (L) 08/09/2022 1510   HCT 34.7 08/09/2022 1510   PLT 413 08/09/2022 1510   MCV 70 (L) 08/09/2022 1510   MCH 21.5 (L) 08/09/2022 1510   MCH 19.1 (L) 11/08/2019 1241   MCHC 30.5 (L) 08/09/2022 1510   MCHC 28.7 (L) 11/08/2019 1241   RDW 18.4 (H) 08/09/2022 1510   Iron Studies    Component Value Date/Time   IRON 105 08/09/2022 1510   TIBC 448 08/09/2022 1510   FERRITIN 19 08/09/2022 1510   IRONPCTSAT  23 08/09/2022 1510   Lipid Panel     Component Value Date/Time   CHOL 156 08/09/2022 1510  TRIG 127 08/09/2022 1510   HDL 53 08/09/2022 1510   LDLCALC 81 08/09/2022 1510   Hepatic Function Panel     Component Value Date/Time   PROT 7.0 08/09/2022 1510   ALBUMIN 3.9 (L) 08/09/2022 1510   AST 13 08/09/2022 1510   ALT 19 08/09/2022 1510   ALKPHOS 83 08/09/2022 1510   BILITOT 0.2 08/09/2022 1510      Component Value Date/Time   TSH 3.890 08/09/2022 1510   Nutritional Lab Results  Component Value Date   VD25OH 28.0 (L) 08/09/2022    Attestations:   This encounter took 48 total minutes of time including any pre-visit and post-visit time spent on this date of service, including taking a thorough history, reviewing any labs and/or imaging, reviewing prior notes, as well as documenting in the electronic health record on the date of service. Over 50% of that time was in direct face-to-face counseling and coordinating care for the patient today  I, Clinical biochemist, acting as a Stage manager for [employer], DO., have compiled all relevant documentation for today's office visit on behalf of Thomasene Lot, DO, while in the presence of [employer], DO.  I have reviewed the above documentation for accuracy and completeness, and I agree with the above. Sonya Knapp, D.O.  The 21st Century Cures Act was signed into law in 2016 which includes the topic of electronic health records.  This provides immediate access to information in MyChart.  This includes consultation notes, operative notes, office notes, lab results and pathology reports.  If you have any questions about what you read please let us know at your next visit so we can discuss your concerns and take corrective action if need be.  We are right here with you.

## 2022-09-26 ENCOUNTER — Ambulatory Visit (INDEPENDENT_AMBULATORY_CARE_PROVIDER_SITE_OTHER): Payer: 59 | Admitting: Family Medicine

## 2022-10-09 ENCOUNTER — Telehealth (INDEPENDENT_AMBULATORY_CARE_PROVIDER_SITE_OTHER): Payer: Self-pay | Admitting: Psychology

## 2022-10-09 ENCOUNTER — Telehealth (INDEPENDENT_AMBULATORY_CARE_PROVIDER_SITE_OTHER): Payer: 59 | Admitting: Psychology

## 2022-10-09 NOTE — Telephone Encounter (Signed)
  Office: [phone]  /  Fax: [phone]  Date of Call: October 09, 2022  Time of Call: 8:33am Duration of Call: ~2 minute(s) Provider: Lawerance Cruel, PsyD  CONTENT: This provider called Sonya Knapp to check-in as she did not present for today's MyChart Video Visit appointment at 8:30am. Sonya Knapp acknowledged she forgot about today's appointment and requested to reschedule. She noted a plan to establish care with [facility] prior to the next appointment with this provider. No evidence or endorsement of any safety concerns. All questions/concerns addressed.   PLAN:  Sonya Knapp is scheduled for an appointment on 10/23/2022 at 8:30am via MyChart Video Visit.

## 2022-10-09 NOTE — Progress Notes (Unsigned)
  Office: [phone]  /  Fax: [phone]    Date: October 09, 2022  Appointment Start Time: *** Duration: *** minutes Provider: Lawerance Cruel, Psy.D. Type of Session: Individual Therapy  Location of Patient: {gbptloc:23249} (private location) Location of Provider: Provider's Home (private office) Type of Contact: Telepsychological Visit via MyChart Video Visit  Session Content: This provider called Sonya Knapp at 8:33am as she did not present for today's appointment. *** As such, today's appointment was initiated *** minutes late.Sonya Knapp is a 22 y.o. female presenting for a follow-up appointment to address the previously established treatment goal of increasing coping skills.Today's appointment was a telepsychological visit. Sonya Knapp provided verbal consent for today's telepsychological appointment and she is aware she is responsible for securing confidentiality on her end of the session. Prior to proceeding with today's appointment, Sonya Knapp's physical location at the time of this appointment was obtained as well a phone number she could be reached at in the event of technical difficulties. Sonya Knapp and this provider participated in today's telepsychological service.   This provider conducted a brief check-in. *** Sonya Knapp was receptive to today's appointment as evidenced by openness to sharing, responsiveness to feedback, and {gbreceptiveness:23401}.  Mental Status Examination:  Appearance: {Appearance:22431} Behavior: {Behavior:22445} Mood: {gbmood:21757} Affect: {Affect:22436} Speech: {Speech:22432} Eye Contact: {Eye Contact:22433} Psychomotor Activity: {Motor Activity:22434} Gait: {gbgait:23404} Thought Process: {thought process:22448}  Thought Content/Perception: {disturbances:22451} Orientation: {Orientation:22437} Memory/Concentration: {gbcognition:22449} Insight: {Insight:22446} Judgment: {Insight:22446}  Interventions:  {Interventions for Progress Notes:23405}  DSM-5 Diagnosis(es):   F50.89 Other Specified Feeding or Eating Disorder, Emotional and Binge Eating Behaviors, F41.1 Generalized Anxiety Disorder, and F33.1  Major Depressive Disorder, Recurrent Episode, Moderate  Treatment Goal & Progress: During the initial appointment with this provider, the following treatment goal was established: increase coping skills. [name] has demonstrated progress in her goal as evidenced by {gbtxprogress:22839}. [name] also {gbtxprogress2:22951}.  Plan: The next appointment is scheduled for *** at ***, which will be via MyChart Video Visit. The next session will focus on {Plan for Next Appointment:23400}.

## 2022-10-11 ENCOUNTER — Encounter (INDEPENDENT_AMBULATORY_CARE_PROVIDER_SITE_OTHER): Payer: Self-pay | Admitting: Family Medicine

## 2022-10-11 ENCOUNTER — Ambulatory Visit (INDEPENDENT_AMBULATORY_CARE_PROVIDER_SITE_OTHER): Payer: 59 | Admitting: Family Medicine

## 2022-10-11 VITALS — BP 116/80 | HR 77 | Temp 97.7°F | Ht 66.0 in | Wt 399.0 lb

## 2022-10-11 DIAGNOSIS — Z6841 Body Mass Index (BMI) 40.0 and over, adult: Secondary | ICD-10-CM

## 2022-10-11 DIAGNOSIS — E669 Obesity, unspecified: Secondary | ICD-10-CM | POA: Diagnosis not present

## 2022-10-11 DIAGNOSIS — E559 Vitamin D deficiency, unspecified: Secondary | ICD-10-CM

## 2022-10-11 DIAGNOSIS — R7303 Prediabetes: Secondary | ICD-10-CM | POA: Diagnosis not present

## 2022-10-11 DIAGNOSIS — D508 Other iron deficiency anemias: Secondary | ICD-10-CM

## 2022-10-11 MED ORDER — IRON (FERROUS SULFATE) 325 (65 FE) MG PO TABS
ORAL_TABLET | ORAL | Status: DC
Start: 2022-10-11 — End: 2023-10-22

## 2022-10-11 MED ORDER — VITAMIN D (ERGOCALCIFEROL) 1.25 MG (50000 UNIT) PO CAPS
50000.0000 [IU] | ORAL_CAPSULE | ORAL | 0 refills | Status: DC
Start: 2022-10-11 — End: 2023-10-22

## 2022-10-11 NOTE — Progress Notes (Unsigned)
Chief Complaint:   OBESITY Sonya Knapp is here to discuss her progress with her obesity treatment plan along with follow-up of her obesity related diagnoses. Sonya Knapp is on keeping a food journal and adhering to recommended goals of 1650-1750 calories and 120+ grams of protein and states she is following her eating plan approximately 20% of the time. Sonya Knapp states she is walking for 30-45 minutes 3 times per week.  Today's visit was #: 4 Starting weight: 388 lbs Starting date: 07/17/2022 Today's weight: 399 lbs Today's date: 10/11/2022 Total lbs lost to date: 0 Total lbs lost since last in-office visit: 0  Interim History: Patient is struggling with meal planning and she has increased eating out.  She is likely not meeting her protein goals and her RMR may be decreasing.  Subjective:   1. Prediabetes Patient is on metformin, and she was increased to twice daily but she is struggling to remember her second dose.  She notes polyphagia.  2. Vitamin D deficiency Patient is stable on vitamin D with no side effects noted.  Her last vitamin D level was not yet at goal.  3. Other iron deficiency anemia Patient is on iron supplementation with no side effects noted.  She is working on increasing iron in her diet as well.  Assessment/Plan:   1. Prediabetes Patient is to take 1-1/2 tablet of metformin in the morning to get better benefit.  She will continue to work on her diet.  2. Vitamin D deficiency Patient will continue once weekly prescription vitamin D 50,000 IU, and we will refill for 1 month.  - Vitamin D, Ergocalciferol, (DRISDOL) 1.25 MG (50000 UNIT) CAPS capsule; Take 1 capsule (50,000 Units total) by mouth every 7 (seven) days.  Dispense: 5 capsule; Refill: 0  3. Other iron deficiency anemia Patient will continue ferrous sulfate 325 mg, and we will refill for 1 month.  - Iron, Ferrous Sulfate, 325 (65 Fe) MG TABS; Pt takes 3 tabs QD  4. BMI 60.0-69.9, adult (HCC)- Current  BMI-63.14  5. Obesity, Beginning BMI 62.0 Sonya Knapp is currently in the action stage of change. As such, her goal is to continue with weight loss efforts. She has agreed to keeping a food journal and adhering to recommended goals of 1650-1750 calories and 120+ grams of protein daily.   Eating Out handout was given.   Exercise goals: As is.   Behavioral modification strategies: decreasing eating out and meal planning and cooking strategies.  Sonya Knapp has agreed to follow-up with our clinic in 3 weeks. She was informed of the importance of frequent follow-up visits to maximize her success with intensive lifestyle modifications for her multiple health conditions.   Objective:   Blood pressure 116/80, pulse 77, temperature 97.7 F (36.5 C), height 5\' 6"  (1.676 m), weight (!) 399 lb (181 kg), SpO2 99%. Body mass index is 64.4 kg/m.  Lab Results  Component Value Date   CREATININE 0.82 08/09/2022   BUN 12 08/09/2022   NA 137 08/09/2022   K 4.4 08/09/2022   CL 102 08/09/2022   CO2 19 (L) 08/09/2022   Lab Results  Component Value Date   ALT 19 08/09/2022   AST 13 08/09/2022   ALKPHOS 83 08/09/2022   BILITOT 0.2 08/09/2022   Lab Results  Component Value Date   HGBA1C 6.1 (H) 08/09/2022   Lab Results  Component Value Date   INSULIN 106.0 (H) 08/09/2022   Lab Results  Component Value Date   TSH 3.890 08/09/2022  Lab Results  Component Value Date   CHOL 156 08/09/2022   HDL 53 08/09/2022   LDLCALC 81 08/09/2022   TRIG 127 08/09/2022   Lab Results  Component Value Date   VD25OH 28.0 (L) 08/09/2022   Lab Results  Component Value Date   WBC 6.4 08/09/2022   HGB 10.6 (L) 08/09/2022   HCT 34.7 08/09/2022   MCV 70 (L) 08/09/2022   PLT 413 08/09/2022   Lab Results  Component Value Date   IRON 105 08/09/2022   TIBC 448 08/09/2022   FERRITIN 19 08/09/2022   Attestation Statements:   Reviewed by clinician on day of visit: allergies, medications, problem list, medical  history, surgical history, family history, social history, and previous encounter notes.   I, Burt Knack, am acting as transcriptionist for Quillian Quince, MD.  I have reviewed the above documentation for accuracy and completeness, and I agree with the above. -  Quillian Quince, MD

## 2022-10-23 ENCOUNTER — Encounter (INDEPENDENT_AMBULATORY_CARE_PROVIDER_SITE_OTHER): Payer: Self-pay

## 2022-10-23 ENCOUNTER — Encounter (INDEPENDENT_AMBULATORY_CARE_PROVIDER_SITE_OTHER): Payer: 59 | Admitting: Psychology

## 2022-10-23 ENCOUNTER — Telehealth (INDEPENDENT_AMBULATORY_CARE_PROVIDER_SITE_OTHER): Payer: Self-pay | Admitting: Psychology

## 2022-10-23 NOTE — Progress Notes (Signed)
Entered in error

## 2022-10-23 NOTE — Telephone Encounter (Signed)
  Office: [phone]  /  Fax: [phone]  Date of Encounter: October 23, 2022  Time of Encounter: 8:30am Duration of Encounter: ~ 3 minute(s) Provider: Lawerance Cruel, PsyD  CONTENT:  Sonya Knapp presented for today's appointment via MyChart Video Visit. She indicated she is sick and was receptive to rescheduling. A brief check-in was conducted. Sonya Knapp requested the phone number for [facility]. The number was provided and she noted a plan to call today to establish care. No evidence or endorsement of safety concerns. All questions/concerns addressed.   PLAN: [name] is scheduled for an appointment on 11/06/2022 at 8:30am via MyChart Video Visit.

## 2022-10-25 ENCOUNTER — Ambulatory Visit (INDEPENDENT_AMBULATORY_CARE_PROVIDER_SITE_OTHER): Payer: 59 | Admitting: Family Medicine

## 2022-11-06 ENCOUNTER — Telehealth (INDEPENDENT_AMBULATORY_CARE_PROVIDER_SITE_OTHER): Payer: 59 | Admitting: Psychology

## 2022-11-08 ENCOUNTER — Ambulatory Visit (INDEPENDENT_AMBULATORY_CARE_PROVIDER_SITE_OTHER): Payer: 59 | Admitting: Family Medicine

## 2023-10-21 ENCOUNTER — Emergency Department: Payer: Self-pay

## 2023-10-21 ENCOUNTER — Encounter: Payer: Self-pay | Admitting: Emergency Medicine

## 2023-10-21 ENCOUNTER — Other Ambulatory Visit: Payer: Self-pay

## 2023-10-21 ENCOUNTER — Inpatient Hospital Stay
Admission: EM | Admit: 2023-10-21 | Discharge: 2023-10-23 | DRG: 417 | Disposition: A | Discharge status: Home / Self Care | Payer: Self-pay

## 2023-10-21 ENCOUNTER — Inpatient Hospital Stay: Payer: Self-pay

## 2023-10-21 DIAGNOSIS — Z6841 Body Mass Index (BMI) 40.0 and over, adult: Secondary | ICD-10-CM

## 2023-10-21 DIAGNOSIS — Z8249 Family history of ischemic heart disease and other diseases of the circulatory system: Secondary | ICD-10-CM

## 2023-10-21 DIAGNOSIS — D509 Iron deficiency anemia, unspecified: Secondary | ICD-10-CM | POA: Diagnosis present

## 2023-10-21 DIAGNOSIS — Z7984 Long term (current) use of oral hypoglycemic drugs: Secondary | ICD-10-CM | POA: Diagnosis not present

## 2023-10-21 DIAGNOSIS — K760 Fatty (change of) liver, not elsewhere classified: Secondary | ICD-10-CM | POA: Diagnosis present

## 2023-10-21 DIAGNOSIS — K8001 Calculus of gallbladder with acute cholecystitis with obstruction: Secondary | ICD-10-CM | POA: Diagnosis present

## 2023-10-21 DIAGNOSIS — F419 Anxiety disorder, unspecified: Secondary | ICD-10-CM | POA: Diagnosis present

## 2023-10-21 DIAGNOSIS — E66813 Obesity, class 3: Secondary | ICD-10-CM | POA: Diagnosis present

## 2023-10-21 DIAGNOSIS — Z818 Family history of other mental and behavioral disorders: Secondary | ICD-10-CM

## 2023-10-21 DIAGNOSIS — R7303 Prediabetes: Secondary | ICD-10-CM | POA: Diagnosis present

## 2023-10-21 DIAGNOSIS — E119 Type 2 diabetes mellitus without complications: Secondary | ICD-10-CM | POA: Diagnosis present

## 2023-10-21 DIAGNOSIS — K219 Gastro-esophageal reflux disease without esophagitis: Secondary | ICD-10-CM | POA: Diagnosis present

## 2023-10-21 DIAGNOSIS — K8021 Calculus of gallbladder without cholecystitis with obstruction: Secondary | ICD-10-CM

## 2023-10-21 DIAGNOSIS — F32A Depression, unspecified: Secondary | ICD-10-CM | POA: Diagnosis present

## 2023-10-21 DIAGNOSIS — K851 Biliary acute pancreatitis without necrosis or infection: Principal | ICD-10-CM | POA: Diagnosis present

## 2023-10-21 DIAGNOSIS — R0789 Other chest pain: Secondary | ICD-10-CM | POA: Diagnosis present

## 2023-10-21 DIAGNOSIS — R7989 Other specified abnormal findings of blood chemistry: Secondary | ICD-10-CM | POA: Diagnosis present

## 2023-10-21 LAB — HEPATIC FUNCTION PANEL
ALT: 674 U/L — ABNORMAL HIGH (ref 0–44)
AST: 582 U/L — ABNORMAL HIGH (ref 15–41)
Albumin: 3.6 g/dL (ref 3.5–5.0)
Alkaline Phosphatase: 211 U/L — ABNORMAL HIGH (ref 38–126)
Bilirubin, Direct: 2.2 mg/dL — ABNORMAL HIGH (ref 0.0–0.2)
Indirect Bilirubin: 1.7 mg/dL — ABNORMAL HIGH (ref 0.3–0.9)
Total Bilirubin: 3.9 mg/dL — ABNORMAL HIGH (ref 0.0–1.2)
Total Protein: 7.6 g/dL (ref 6.5–8.1)

## 2023-10-21 LAB — PROTIME-INR
INR: 1.1 (ref 0.8–1.2)
Prothrombin Time: 15.3 s — ABNORMAL HIGH (ref 11.4–15.2)

## 2023-10-21 LAB — TROPONIN I (HIGH SENSITIVITY)
Troponin I (High Sensitivity): 4 ng/L (ref <18)
Troponin I (High Sensitivity): 4 ng/L (ref <18)

## 2023-10-21 LAB — BASIC METABOLIC PANEL WITH GFR
Anion gap: 12 (ref 5–15)
BUN: 15 mg/dL (ref 6–20)
CO2: 21 mmol/L — ABNORMAL LOW (ref 22–32)
Calcium: 8.9 mg/dL (ref 8.9–10.3)
Chloride: 103 mmol/L (ref 98–111)
Creatinine, Ser: 0.69 mg/dL (ref 0.44–1.00)
GFR, Estimated: >60 mL/min (ref >60)
Glucose, Bld: 80 mg/dL (ref 70–99)
Potassium: 3.8 mmol/L (ref 3.5–5.1)
Sodium: 136 mmol/L (ref 135–145)

## 2023-10-21 LAB — CBC
HCT: 36.5 % (ref 36.0–46.0)
Hemoglobin: 11.5 g/dL — ABNORMAL LOW (ref 12.0–15.0)
MCH: 24.1 pg — ABNORMAL LOW (ref 26.0–34.0)
MCHC: 31.5 g/dL (ref 30.0–36.0)
MCV: 76.4 fL — ABNORMAL LOW (ref 80.0–100.0)
Platelets: 328 K/uL (ref 150–400)
RBC: 4.78 MIL/uL (ref 3.87–5.11)
RDW: 16.2 % — ABNORMAL HIGH (ref 11.5–15.5)
WBC: 5.2 K/uL (ref 4.0–10.5)
nRBC: 0 % (ref 0.0–0.2)

## 2023-10-21 LAB — APTT: aPTT: 34 s (ref 24–36)

## 2023-10-21 LAB — POC URINE PREG, ED: Preg Test, Ur: NEGATIVE

## 2023-10-21 LAB — LIPASE, BLOOD: Lipase: 229 U/L — ABNORMAL HIGH (ref 11–51)

## 2023-10-21 MED ORDER — IBUPROFEN 400 MG PO TABS: 400.0000 mg | ORAL_TABLET | Freq: Four times a day (QID) | ORAL | Status: DC | PRN

## 2023-10-21 MED ORDER — OXYCODONE HCL 5 MG PO TABS
10.0000 mg | ORAL_TABLET | Freq: Four times a day (QID) | ORAL | Status: DC | PRN
  Administered 2023-10-22 (×2): 10 mg via ORAL
  Filled 2023-10-21 (×3): qty 2

## 2023-10-21 MED ORDER — ONDANSETRON HCL 4 MG/2ML IJ SOLN
4.0000 mg | Freq: Once | INTRAMUSCULAR | Status: AC
  Administered 2023-10-21: 4 mg via INTRAVENOUS
  Filled 2023-10-21: qty 2

## 2023-10-21 MED ORDER — MORPHINE SULFATE (PF) 2 MG/ML IV SOLN: 2.0000 mg | INTRAVENOUS | Status: DC | PRN

## 2023-10-21 MED ORDER — SODIUM CHLORIDE 0.9 % IV SOLN: INTRAVENOUS | Status: DC

## 2023-10-21 MED ORDER — SODIUM CHLORIDE 0.9 % IV BOLUS
500.0000 mL | Freq: Once | INTRAVENOUS | Status: AC
  Administered 2023-10-21: 500 mL via INTRAVENOUS

## 2023-10-21 MED ORDER — FAMOTIDINE IN NACL 20-0.9 MG/50ML-% IV SOLN
20.0000 mg | Freq: Once | INTRAVENOUS | Status: AC
  Administered 2023-10-21: 20 mg via INTRAVENOUS
  Filled 2023-10-21: qty 50

## 2023-10-21 MED ORDER — GADOBUTROL 1 MMOL/ML IV SOLN
10.0000 mL | Freq: Once | INTRAVENOUS | Status: AC | PRN
  Administered 2023-10-21: 10 mL via INTRAVENOUS

## 2023-10-21 MED ORDER — ONDANSETRON HCL 4 MG/2ML IJ SOLN
4.0000 mg | Freq: Three times a day (TID) | INTRAMUSCULAR | Status: DC | PRN
  Administered 2023-10-22: 4 mg via INTRAVENOUS
  Filled 2023-10-21: qty 2

## 2023-10-21 MED ORDER — HEPARIN SODIUM (PORCINE) 5000 UNIT/ML IJ SOLN
5000.0000 [IU] | Freq: Three times a day (TID) | INTRAMUSCULAR | Status: DC
  Administered 2023-10-22 – 2023-10-23 (×3): 5000 [IU] via SUBCUTANEOUS
  Filled 2023-10-21 (×3): qty 1

## 2023-10-21 NOTE — ED Notes (Signed)
 Blue, green, and lav top tubes collected and sent to lab

## 2023-10-21 NOTE — ED Notes (Signed)
 Call placed to radiology informing patient has a bed upstairs and will be transported from there to call RN once finished

## 2023-10-21 NOTE — H&P (Signed)
 History and Physical    Sonya Knapp FMW:[id number] DOB: 2000-02-14 DOA: 10/21/2023  Referring MD/NP/PA:   PCP: Pcp, No   Patient coming from:  The patient is coming from home.     Chief Complaint: Nausea, vomiting, epigastric abdominal pain, lower chest pain  HPI: Sonya Knapp is a 23 y.o. female with medical history significant of prediabetes, PCOS, depression with anxiety, morbid obesity, anemia, who presents with nausea, vomiting, epigastric abdominal pain and lower chest pain.  Patient states that her symptoms started yesterday, including nausea and few times of nonbilious nonbloody vomiting and epigastric abdominal pain.  No diarrhea.  She still has nausea today, but no vomiting currently.  No fever or chills.  Her epigastric abdominal pain is constant, aching, moderate, radiating to to the lower chest, not aggravated or alleviated by any known factors.  She also reports frontal lower chest pain, which is moderate, sharp, nonradiating.  She had mild SOB earlier, which is resolved.  Currently no cough or SOB.  No symptoms of UTI.  Data reviewed independently and ED Course: pt was found to have lipase 229, WBC 5.2, GFR > 60, abnormal liver function (ALP 211, AST 582, ALT 674, total bilirubin 3.9, direct bilirubin 2.2), troponin 4, negative pregnancy test.  Negative chest x-ray.  Patient is admitted to telemetry bed as inpatient.  Right upper quadrant ultrasound: 1. Cholelithiasis without sonographic evidence of acute cholecystitis. 2. Hepatic steatosis. 3. Common bile duct measures 5 mm    EKG: I have personally reviewed.  Sinus rhythm, QTc 399, low voltage, early R wave progression, nonspecific T wave change.   Review of Systems:   General: no fevers, chills, no body weight gain, has fatigue HEENT: no blurry vision, hearing changes or sore throat Respiratory: has dyspnea, no coughing, wheezing CV: has chest pain, no palpitations GI: has nausea, vomiting, abdominal pain, no  diarrhea, constipation GU: no dysuria, burning on urination, increased urinary frequency, hematuria  Ext: no leg edema Neuro: no unilateral weakness, numbness, or tingling, no vision change or hearing loss Skin: no rash, no skin tear. MSK: No muscle spasm, no deformity, no limitation of range of movement in spin Heme: No easy bruising.  Travel history: No recent long distant travel.   Allergy: No Known Allergies  Past Medical History:  Diagnosis Date   Anemia    Anxiety    Back pain    Chest pain    Depression    High blood pressure    PCOS (polycystic ovarian syndrome)    Pre-diabetes    Shortness of breath     History reviewed. No pertinent surgical history.  Social History:  reports that she has never smoked. She has never used smokeless tobacco. She reports that she does not drink alcohol and does not use drugs.  Family History:  Family History  Problem Relation Age of Onset   Obesity Mother    Hypertension Mother    Depression Father      Prior to Admission medications   Medication Sig Start Date End Date Taking? Authorizing Provider  Iron , Ferrous Sulfate , 325 (65 Fe) MG TABS Pt takes 3 tabs QD 10/11/22   Verdon Parry D, MD  metFORMIN  (GLUCOPHAGE ) 500 MG tablet Take 1 tablet (500 mg total) by mouth 2 (two) times daily with a meal. Lunch and dinner. 09/18/22 10/18/22  Opalski, Barnie, DO  norgestimate -ethinyl estradiol  (SPRINTEC 28) 0.25-35 MG-MCG tablet Take 1 tablet by mouth daily. 06/29/21   Ward, Josette SAILOR, DO  Vitamin D , Ergocalciferol , (  DRISDOL ) 1.25 MG (50000 UNIT) CAPS capsule Take 1 capsule (50,000 Units total) by mouth every 7 (seven) days. 10/11/22   Verdon Parry D, MD    Physical Exam: Vitals:   10/21/23 2130 10/21/23 2230 10/21/23 2351 10/22/23 0010  BP: 115/81 (!) 96/58 123/67   Pulse: 66 65 69   Resp: 16 19    Temp:  98.6 F (37 C) 98.2 F (36.8 C)   TempSrc:  Oral Oral   SpO2: 100% 100% 100%   Weight:    (!) 161.2 kg  Height:    5' 6  (1.676 m)   General: Not in acute distress HEENT:       Eyes: PERRL, EOMI, has mild jaundice       ENT: No discharge from the ears and nose, no pharynx injection, no tonsillar enlargement.        Neck: No JVD, no bruit, no mass felt. Heme: No neck lymph node enlargement. Cardiac: S1/S2, RRR, No murmurs, No gallops or rubs. Respiratory: No rales, wheezing, rhonchi or rubs. GI: Soft, nondistended, has tenderness in the upper abdomen, no rebound pain, no organomegaly, BS present. GU: No hematuria Ext: No pitting leg edema bilaterally. 1+DP/PT pulse bilaterally. Musculoskeletal: No joint deformities, No joint redness or warmth, no limitation of ROM in spin. Skin: No rashes.  Neuro: Alert, oriented X3, cranial nerves II-XII grossly intact, moves all extremities normally.  Labs on Admission: I have personally reviewed following labs and imaging studies  CBC: Recent Labs  Lab 10/21/23 1938  WBC 5.2  HGB 11.5*  HCT 36.5  MCV 76.4*  PLT 328   Basic Metabolic Panel: Recent Labs  Lab 10/21/23 1938  NA 136  K 3.8  CL 103  CO2 21*  GLUCOSE 80  BUN 15  CREATININE 0.69  CALCIUM 8.9   GFR: Estimated Creatinine Clearance: 174.3 mL/min (by C-G formula based on SCr of 0.69 mg/dL). Liver Function Tests: Recent Labs  Lab 10/21/23 1938  AST 582*  ALT 674*  ALKPHOS 211*  BILITOT 3.9*  PROT 7.6  ALBUMIN 3.6   Recent Labs  Lab 10/21/23 1938  LIPASE 229*   No results for input(s): AMMONIA in the last 168 hours. Coagulation Profile: Recent Labs  Lab 10/21/23 1938  INR 1.1   Cardiac Enzymes: No results for input(s): CKTOTAL, CKMB, CKMBINDEX, TROPONINI in the last 168 hours. BNP (last 3 results) No results for input(s): PROBNP in the last 8760 hours. HbA1C: No results for input(s): HGBA1C in the last 72 hours. CBG: No results for input(s): GLUCAP in the last 168 hours. Lipid Profile: Recent Labs    10/21/23 1938  TRIG 39   Thyroid Function  Tests: No results for input(s): TSH, T4TOTAL, FREET4, T3FREE, THYROIDAB in the last 72 hours. Anemia Panel: No results for input(s): VITAMINB12, FOLATE, FERRITIN, TIBC, IRON , RETICCTPCT in the last 72 hours. Urine analysis: No results found for: COLORURINE, APPEARANCEUR, LABSPEC, PHURINE, GLUCOSEU, HGBUR, BILIRUBINUR, KETONESUR, PROTEINUR, UROBILINOGEN, NITRITE, LEUKOCYTESUR Sepsis Labs: @LABRCNTIP (procalcitonin:4,lacticidven:4) )No results found for this or any previous visit (from the past 240 hours).   Radiological Exams on Admission:   Assessment/Plan Principal Problem:   Acute gallstone pancreatitis Active Problems:   Abnormal LFTs   Atypical chest pain   Prediabetes   Microcytic anemia   Morbid obesity (HCC)   Assessment and Plan:   Acute gallstone pancreatitis: Lipase 229. RUQ-US  showed cholelithiasis without sonographic evidence of acute cholecystitis. Common bile duct measures 5 mm.  Patient does not have leukocytosis and fever,  will hold off antibiotics now.  -will admit to tele bed as inpt -NPO now -Pain control: As needed morphine, oxycodone -IVF: 500 cc of NS in ED, then 125 cc/h -repeat Lipase in AM -f/u MRCP to r/o choledocholithiasis -check triglyceride level  Abnormal LFTs: Likely due to gallstone.  Ultrasound showed hepatic steatosis.  Patient does not drink alcohol. - Avoid using Tylenol -Follow-up with CMP  Atypical chest pain: Her chest pain seems to be radiated from her upper abdominal pain.  Initial troponin negative 4. - Pain control as above - Get another troponin x1  Prediabetes: Recent A1c 5 point 09/06/2022.  Patient's not taking medications currently -check CBG every morning  Microcytic anemia: Hemoglobin stable 11.5 (10.6 on 08/09/2022) -Follow-up CBC  Morbid obesity: The patient was just seen by family medicine on 10/9 for follow-up of her chronic conditions and review of her obesity treatment  plan. Patient has Obesity Class III, with body weight 181  Kg and BMI 64.43 kg/m2.  - Encouraged losing weight - Exercise and healthy diet     DVT ppx: SQ Heparin     Code Status: Full code   Family Communication:     not done, no family member is at bed side.        Disposition Plan:  Anticipate discharge back to previous environment  Consults called:    none   Admission status and Level of care: Telemetry Medical:  as inpt        Dispo: The patient is from: Home              Anticipated d/c is to: Home              Anticipated d/c date is: 2 days              Patient currently is not medically stable to d/c.    Severity of Illness:  The appropriate patient status for this patient is INPATIENT. Inpatient status is judged to be reasonable and necessary in order to provide the required intensity of service to ensure the patient's safety. The patient's presenting symptoms, physical exam findings, and initial radiographic and laboratory data in the context of their chronic comorbidities is felt to place them at high risk for further clinical deterioration. Furthermore, it is not anticipated that the patient will be medically stable for discharge from the hospital within 2 midnights of admission.   * I certify that at the point of admission it is my clinical judgment that the patient will require inpatient hospital care spanning beyond 2 midnights from the point of admission due to high intensity of service, high risk for further deterioration and high frequency of surveillance required.*       Date of Service 10/22/2023    Caleb Exon [facility]   If 7PM-7AM, please contact night-coverage [email or web address] 10/22/2023, 1:01 AM

## 2023-10-21 NOTE — ED Notes (Signed)
 Call placed to lab to add on hepatic panel and lipase

## 2023-10-21 NOTE — ED Notes (Signed)
Patient transported to MRCP

## 2023-10-21 NOTE — ED Notes (Signed)
 Patient transported to X-ray

## 2023-10-21 NOTE — ED Provider Notes (Signed)
 [facility] Provider Note    Event Date/Time   First MD Initiated Contact with Patient 10/21/23 2002     (approximate)   History   Chest Pain and Emesis   HPI  Sonya Knapp is a 23 y.o. female with a history of diabetes and obesity who presents with chest pain over the last 2 days, intermittent, sharp, somewhat pleuritic, mainly substernal but sometimes slightly on the right.  She also reports some mild epigastric pain.  She has had nausea and vomiting since yesterday although did not vomit today.  She just feels nauseous now.  She denies feeling dizzy or lightheaded but is somewhat short of breath.  She has no cough.  She denies any nasal congestion or sore throat.  She has no fever, chills, or bodyaches.  I reviewed the past medical records.  The patient was just seen by family medicine on 10/9 for follow-up of her chronic conditions and review of her obesity treatment plan.   Physical Exam   Triage Vital Signs: ED Triage Vitals  Encounter Vitals Group     BP 10/21/23 1937 (!) 149/104     Girls Systolic BP Percentile --      Girls Diastolic BP Percentile --      Boys Systolic BP Percentile --      Boys Diastolic BP Percentile --      Pulse Rate 10/21/23 1937 68     Resp 10/21/23 1937 18     Temp 10/21/23 1937 99.6 F (37.6 C)     Temp Source 10/21/23 1937 Oral     SpO2 10/21/23 1937 100 %     Weight --      Height --      Head Circumference --      Peak Flow --      Pain Score 10/21/23 1934 7     Pain Loc --      Pain Education --      Exclude from Growth Chart --     Most recent vital signs: Vitals:   10/21/23 2130 10/21/23 2230  BP: 115/81 (!) 96/58  Pulse: 66 65  Resp: 16 19  Temp:  98.6 F (37 C)  SpO2: 100% 100%    General: Awake, no distress.  CV:  Good peripheral perfusion.  Resp:  Normal effort.  Lungs CTAB. Abd:  Mild epigastric discomfort.  No focal tenderness.  No distention.  Other:  No peripheral edema.  No calf or  popliteal swelling or tenderness.   ED Results / Procedures / Treatments   Labs (all labs ordered are listed, but only abnormal results are displayed) Labs Reviewed  BASIC METABOLIC PANEL WITH GFR - Abnormal; Notable for the following components:      Result Value   CO2 21 (*)    All other components within normal limits  CBC - Abnormal; Notable for the following components:   Hemoglobin 11.5 (*)    MCV 76.4 (*)    MCH 24.1 (*)    RDW 16.2 (*)    All other components within normal limits  HEPATIC FUNCTION PANEL - Abnormal; Notable for the following components:   AST 582 (*)    ALT 674 (*)    Alkaline Phosphatase 211 (*)    Total Bilirubin 3.9 (*)    Bilirubin, Direct 2.2 (*)    Indirect Bilirubin 1.7 (*)    All other components within normal limits  LIPASE, BLOOD - Abnormal; Notable for the following components:  Lipase 229 (*)    All other components within normal limits  APTT  LIPASE, BLOOD  POC URINE PREG, ED  TROPONIN I (HIGH SENSITIVITY)  TROPONIN I (HIGH SENSITIVITY)     EKG  ED ECG REPORT I, Waylon Cassis, the attending physician, personally viewed and interpreted this ECG.  Date: 10/21/2023 EKG Time: 1938 Rate: 70 Rhythm: normal sinus rhythm QRS Axis: normal Intervals: normal ST/T Wave abnormalities: normal Narrative Interpretation: no evidence of acute ischemia    RADIOLOGY  Chest x-ray: I independently viewed and interpreted images; there is no focal consolidation or edema  US  abdomen RUQ: Cholelithiasis without evidence of acute cholecystitis  PROCEDURES:  Critical Care performed: No  Procedures   MEDICATIONS ORDERED IN ED: Medications  0.9 %  sodium chloride  infusion (has no administration in time range)  morphine (PF) 2 MG/ML injection 2 mg (has no administration in time range)  oxyCODONE (Oxy IR/ROXICODONE) immediate release tablet 10 mg (has no administration in time range)  ibuprofen  (ADVIL ) tablet 400 mg (has no  administration in time range)  ondansetron (ZOFRAN) injection 4 mg (has no administration in time range)  sodium chloride  0.9 % bolus 500 mL (0 mLs Intravenous Stopped 10/21/23 2202)  ondansetron (ZOFRAN) injection 4 mg (4 mg Intravenous Given 10/21/23 2050)  famotidine (PEPCID) IVPB 20 mg premix (0 mg Intravenous Stopped 10/21/23 2202)     IMPRESSION / MDM / ASSESSMENT AND PLAN / ED COURSE  I reviewed the triage vital signs and the nursing notes.  24 year old female presents with atypical chest pain along with some nausea and vomiting over the last couple of days.  On exam she is overall very well-appearing.  She has a borderline elevated temperature.  Blood pressure was normal in triage but is now slightly low.  Lungs are clear to auscultation.  Differential diagnosis includes, but is not limited to, viral syndrome, GERD, gastroenteritis, acute bronchitis, less likely pneumonia.  There is no clinical evidence for PE.  The patient is PERC negative.  There is no evidence of ACS or other cardiac etiology.  We will obtain basic labs, LFTs, lipase, troponin, chest x-ray, give a fluid bolus, Pepcid, Zofran, and reassess.  Patient's presentation is most consistent with acute complicated illness / injury requiring diagnostic workup.  The patient is on the cardiac monitor to evaluate for evidence of arrhythmia and/or significant heart rate changes.  ----------------------------------------- 10:39 PM on 10/21/2023 -----------------------------------------  Chest x-ray is clear.  BMP and CBC show no acute findings.  Troponin is negative.  However, lipase is elevated to 29.  LFTs are also elevated along with the bilirubin.  This is concerning for biliary obstruction.  I obtained a right upper quadrant ultrasound which shows gallstones but no evidence of cholecystitis and the CBD is normal.  I have separately ordered MRCP for further evaluation to determine if the patient may have choledocholithiasis,  although there is no evidence of cholangitis.  Dr. Jinny from GI is on the call schedule for tomorrow, so the patient will be able to get an ERCP if she needs one. The patient will need admission for further management.  I consulted Dr. Hilma from the hospitalist service; based on our discussion he agrees to evaluate the patient for admission.  FINAL CLINICAL IMPRESSION(S) / ED DIAGNOSES   Final diagnoses:  Gallstone pancreatitis  Calculus of gallbladder with biliary obstruction but without cholecystitis     Rx / DC Orders   ED Discharge Orders     None  Note:  This document was prepared using Dragon voice recognition software and may include unintentional dictation errors.    Jacolyn Pae, MD 10/21/23 2248

## 2023-10-21 NOTE — ED Triage Notes (Signed)
 Pt arrives POV, ambulatory to triage, gait steady, no acute distress noted c/o chest pain and emesis since yesterday, described as tightness w/ difficulty breathing. Last episode of emesis was yesterday per pt.

## 2023-10-21 NOTE — ED Notes (Signed)
 Admitting provider at bedside providing update to patient and explaining plan of care

## 2023-10-22 ENCOUNTER — Inpatient Hospital Stay: Payer: Self-pay

## 2023-10-22 ENCOUNTER — Encounter: Payer: Self-pay | Admitting: Internal Medicine

## 2023-10-22 ENCOUNTER — Encounter: Admission: EM | Disposition: A | Payer: Self-pay | Source: Home / Self Care | Attending: Internal Medicine

## 2023-10-22 LAB — COMPREHENSIVE METABOLIC PANEL WITH GFR
ALT: 571 U/L — ABNORMAL HIGH (ref 0–44)
AST: 386 U/L — ABNORMAL HIGH (ref 15–41)
Albumin: 3.2 g/dL — ABNORMAL LOW (ref 3.5–5.0)
Alkaline Phosphatase: 189 U/L — ABNORMAL HIGH (ref 38–126)
Anion gap: 10 (ref 5–15)
BUN: 14 mg/dL (ref 6–20)
CO2: 22 mmol/L (ref 22–32)
Calcium: 8.4 mg/dL — ABNORMAL LOW (ref 8.9–10.3)
Chloride: 105 mmol/L (ref 98–111)
Creatinine, Ser: 0.72 mg/dL (ref 0.44–1.00)
GFR, Estimated: >60 mL/min (ref >60)
Glucose, Bld: 90 mg/dL (ref 70–99)
Potassium: 3.6 mmol/L (ref 3.5–5.1)
Sodium: 137 mmol/L (ref 135–145)
Total Bilirubin: 4.1 mg/dL — ABNORMAL HIGH (ref 0.0–1.2)
Total Protein: 6.9 g/dL (ref 6.5–8.1)

## 2023-10-22 LAB — TROPONIN I (HIGH SENSITIVITY): Troponin I (High Sensitivity): 2 ng/L (ref <18)

## 2023-10-22 LAB — CBC
HCT: 35.1 % — ABNORMAL LOW (ref 36.0–46.0)
Hemoglobin: 11 g/dL — ABNORMAL LOW (ref 12.0–15.0)
MCH: 23.7 pg — ABNORMAL LOW (ref 26.0–34.0)
MCHC: 31.3 g/dL (ref 30.0–36.0)
MCV: 75.6 fL — ABNORMAL LOW (ref 80.0–100.0)
Platelets: 294 K/uL (ref 150–400)
RBC: 4.64 MIL/uL (ref 3.87–5.11)
RDW: 16.6 % — ABNORMAL HIGH (ref 11.5–15.5)
WBC: 4.6 K/uL (ref 4.0–10.5)
nRBC: 0 % (ref 0.0–0.2)

## 2023-10-22 LAB — HIV ANTIBODY (ROUTINE TESTING W REFLEX): HIV Screen 4th Generation wRfx: NONREACTIVE

## 2023-10-22 LAB — LIPASE, BLOOD: Lipase: 81 U/L — ABNORMAL HIGH (ref 11–51)

## 2023-10-22 LAB — TRIGLYCERIDES: Triglycerides: 39 mg/dL (ref <150)

## 2023-10-22 LAB — BILIRUBIN, DIRECT: Bilirubin, Direct: 2 mg/dL — ABNORMAL HIGH (ref 0.0–0.2)

## 2023-10-22 SURGERY — CHOLECYSTECTOMY, ROBOT-ASSISTED, LAPAROSCOPIC
Anesthesia: General

## 2023-10-22 MED ORDER — LIDOCAINE HCL (CARDIAC) PF 100 MG/5ML IV SOSY
PREFILLED_SYRINGE | INTRAVENOUS | Status: DC | PRN
  Administered 2023-10-22: 80 mg via INTRAVENOUS

## 2023-10-22 MED ORDER — ROCURONIUM BROMIDE 10 MG/ML (PF) SYRINGE
PREFILLED_SYRINGE | INTRAVENOUS | Status: AC
Start: 2023-10-22 — End: 2023-10-22
  Filled 2023-10-22: qty 10

## 2023-10-22 MED ORDER — LACTATED RINGERS IV SOLN: INTRAVENOUS | Status: DC

## 2023-10-22 MED ORDER — MIDAZOLAM HCL 2 MG/2ML IJ SOLN
INTRAMUSCULAR | Status: AC
  Filled 2023-10-22: qty 2

## 2023-10-22 MED ORDER — SUCCINYLCHOLINE CHLORIDE 200 MG/10ML IV SOSY
PREFILLED_SYRINGE | INTRAVENOUS | Status: DC | PRN
  Administered 2023-10-22: 100 mg via INTRAVENOUS

## 2023-10-22 MED ORDER — PROPOFOL 10 MG/ML IV BOLUS
INTRAVENOUS | Status: DC | PRN
  Administered 2023-10-22: 250 mg via INTRAVENOUS

## 2023-10-22 MED ORDER — 0.9 % SODIUM CHLORIDE (POUR BTL) OPTIME
TOPICAL | Status: DC | PRN
  Administered 2023-10-22: 500 mL

## 2023-10-22 MED ORDER — OXYCODONE HCL 5 MG PO TABS
ORAL_TABLET | ORAL | Status: AC
  Filled 2023-10-22: qty 1

## 2023-10-22 MED ORDER — DROPERIDOL 2.5 MG/ML IJ SOLN: 0.6250 mg | Freq: Once | INTRAMUSCULAR | Status: DC | PRN

## 2023-10-22 MED ORDER — KETOROLAC TROMETHAMINE 30 MG/ML IJ SOLN
INTRAMUSCULAR | Status: DC | PRN
  Administered 2023-10-22: 30 mg via INTRAVENOUS

## 2023-10-22 MED ORDER — BUPIVACAINE-EPINEPHRINE 0.25% -1:200000 IJ SOLN
INTRAMUSCULAR | Status: DC | PRN
  Administered 2023-10-22: 30 mL

## 2023-10-22 MED ORDER — FENTANYL CITRATE (PF) 100 MCG/2ML IJ SOLN
INTRAMUSCULAR | Status: AC
  Filled 2023-10-22: qty 2

## 2023-10-22 MED ORDER — PROPOFOL 10 MG/ML IV BOLUS
INTRAVENOUS | Status: AC
  Filled 2023-10-22: qty 20

## 2023-10-22 MED ORDER — PHENYLEPHRINE 80 MCG/ML (10ML) SYRINGE FOR IV PUSH (FOR BLOOD PRESSURE SUPPORT)
PREFILLED_SYRINGE | INTRAVENOUS | Status: DC | PRN
  Administered 2023-10-22 (×4): 80 ug via INTRAVENOUS

## 2023-10-22 MED ORDER — KETOROLAC TROMETHAMINE 30 MG/ML IJ SOLN
INTRAMUSCULAR | Status: AC
  Filled 2023-10-22: qty 1

## 2023-10-22 MED ORDER — LACTATED RINGERS IV SOLN
INTRAVENOUS | Status: DC
Start: 2023-10-22 — End: 2023-10-22

## 2023-10-22 MED ORDER — ROCURONIUM BROMIDE 100 MG/10ML IV SOLN
INTRAVENOUS | Status: DC | PRN
Start: 2023-10-22 — End: 2023-10-22
  Administered 2023-10-22: 50 mg via INTRAVENOUS

## 2023-10-22 MED ORDER — MORPHINE SULFATE (PF) 2 MG/ML IV SOLN: 2.0000 mg | INTRAVENOUS | Status: DC | PRN

## 2023-10-22 MED ORDER — PROPOFOL 500 MG/50ML IV EMUL
INTRAVENOUS | Status: DC | PRN
  Administered 2023-10-22: 75 ug/kg/min via INTRAVENOUS

## 2023-10-22 MED ORDER — ONDANSETRON HCL 4 MG/2ML IJ SOLN
INTRAMUSCULAR | Status: DC | PRN
  Administered 2023-10-22: 4 mg via INTRAVENOUS

## 2023-10-22 MED ORDER — MIDAZOLAM HCL (PF) 2 MG/2ML IJ SOLN
INTRAMUSCULAR | Status: DC | PRN
Start: 2023-10-22 — End: 2023-10-22
  Administered 2023-10-22: 2 mg via INTRAVENOUS

## 2023-10-22 MED ORDER — DEXAMETHASONE SOD PHOSPHATE PF 10 MG/ML IJ SOLN
INTRAMUSCULAR | Status: DC | PRN
Start: 2023-10-22 — End: 2023-10-22
  Administered 2023-10-22: 5 mg via INTRAVENOUS

## 2023-10-22 MED ORDER — CHLORHEXIDINE GLUCONATE 0.12 % MT SOLN
15.0000 mL | Freq: Once | OROMUCOSAL | Status: AC
  Administered 2023-10-22: 15 mL via OROMUCOSAL

## 2023-10-22 MED ORDER — OXYCODONE HCL 5 MG PO TABS: 10.0000 mg | ORAL_TABLET | ORAL | Status: DC | PRN

## 2023-10-22 MED ORDER — LIDOCAINE HCL (PF) 2 % IJ SOLN
INTRAMUSCULAR | Status: AC
  Filled 2023-10-22: qty 5

## 2023-10-22 MED ORDER — FENTANYL CITRATE (PF) 100 MCG/2ML IJ SOLN
INTRAMUSCULAR | Status: DC | PRN
  Administered 2023-10-22 (×2): 50 ug via INTRAVENOUS

## 2023-10-22 MED ORDER — SUGAMMADEX SODIUM 500 MG/5ML IV SOLN
INTRAVENOUS | Status: DC | PRN
  Administered 2023-10-22: 400 mg via INTRAVENOUS

## 2023-10-22 MED ORDER — INDOCYANINE GREEN 25 MG IV SOLR
1.2500 mg | Freq: Once | INTRAVENOUS | Status: AC
  Administered 2023-10-22: 1.25 mg via INTRAVENOUS

## 2023-10-22 MED ORDER — EPINEPHRINE PF 1 MG/ML IJ SOLN
INTRAMUSCULAR | Status: AC
  Filled 2023-10-22: qty 1

## 2023-10-22 MED ORDER — PROPOFOL 1000 MG/100ML IV EMUL
INTRAVENOUS | Status: AC
  Filled 2023-10-22: qty 100

## 2023-10-22 MED ORDER — MORPHINE SULFATE (PF) 4 MG/ML IV SOLN
4.0000 mg | INTRAVENOUS | Status: DC | PRN
  Administered 2023-10-22: 4 mg via INTRAVENOUS
  Filled 2023-10-22: qty 1

## 2023-10-22 MED ORDER — CEFAZOLIN SODIUM-DEXTROSE 3-4 GM/150ML-% IV SOLN
3.0000 g | Freq: Once | INTRAVENOUS | Status: AC
  Administered 2023-10-22: 3 g via INTRAVENOUS
  Filled 2023-10-22: qty 150

## 2023-10-22 MED ORDER — FENTANYL CITRATE (PF) 100 MCG/2ML IJ SOLN
25.0000 ug | INTRAMUSCULAR | Status: DC | PRN
  Administered 2023-10-22: 25 ug via INTRAVENOUS
  Administered 2023-10-22 (×2): 50 ug via INTRAVENOUS
  Administered 2023-10-22: 25 ug via INTRAVENOUS

## 2023-10-22 MED ORDER — ONDANSETRON HCL 4 MG/2ML IJ SOLN
INTRAMUSCULAR | Status: AC
  Filled 2023-10-22: qty 2

## 2023-10-22 MED ORDER — OXYCODONE HCL 5 MG PO TABS
5.0000 mg | ORAL_TABLET | ORAL | Status: DC | PRN
  Administered 2023-10-22 – 2023-10-23 (×6): 5 mg via ORAL
  Filled 2023-10-22 (×5): qty 1

## 2023-10-22 MED ORDER — CHLORHEXIDINE GLUCONATE 0.12 % MT SOLN
OROMUCOSAL | Status: AC
  Filled 2023-10-22: qty 15

## 2023-10-22 SURGICAL SUPPLY — 42 items
BAG PRESSURE INF REUSE 1000 (BAG) IMPLANT
CANNULA REDUCER 12-8 DVNC XI (CANNULA) ×1 IMPLANT
CAUTERY HOOK MNPLR 1.6 DVNC XI (INSTRUMENTS) ×1 IMPLANT
CLIP LIGATING HEM O LOK PURPLE (MISCELLANEOUS) IMPLANT
CLIP LIGATING HEMO O LOK GREEN (MISCELLANEOUS) ×1 IMPLANT
DEFOGGER SCOPE WARM SEASHARP (MISCELLANEOUS) ×1 IMPLANT
DERMABOND ADVANCED .7 DNX12 (GAUZE/BANDAGES/DRESSINGS) ×1 IMPLANT
DRAPE ARM DVNC X/XI (DISPOSABLE) ×4 IMPLANT
DRAPE C-ARM XRAY 36X54 (DRAPES) IMPLANT
DRAPE COLUMN DVNC XI (DISPOSABLE) ×1 IMPLANT
ELECTRODE REM PT RTRN 9FT ADLT (ELECTROSURGICAL) ×1 IMPLANT
FORCEPS BPLR 8 MD DVNC XI (FORCEP) IMPLANT
FORCEPS BPLR FENES DVNC XI (FORCEP) ×1 IMPLANT
FORCEPS PROGRASP DVNC XI (FORCEP) ×1 IMPLANT
GLOVE BIO SURGEON STRL SZ 6.5 (GLOVE) ×2 IMPLANT
GLOVE BIOGEL PI IND STRL 6.5 (GLOVE) ×2 IMPLANT
GLOVE SURG SYN 6.5 PF PI (GLOVE) ×2 IMPLANT
GOWN STRL REUS W/ TWL LRG LVL3 (GOWN DISPOSABLE) ×4 IMPLANT
GRASPER SUT TROCAR 14GX15 (MISCELLANEOUS) ×1 IMPLANT
IRRIGATOR SUCT 8 DISP DVNC XI (IRRIGATION / IRRIGATOR) IMPLANT
IV 0.9% NACL 1000 ML (IV SOLUTION) IMPLANT
IV CATH ANGIO 12GX3 LT BLUE (NEEDLE) IMPLANT
KIT PINK PAD W/HEAD ARM REST (MISCELLANEOUS) ×1 IMPLANT
LABEL OR SOLS (LABEL) ×1 IMPLANT
MANIFOLD NEPTUNE II (INSTRUMENTS) IMPLANT
NDL HYPO 22X1.5 SAFETY MO (MISCELLANEOUS) ×1 IMPLANT
NDL INSUFFLATION 14GA 120MM (NEEDLE) ×1 IMPLANT
NEEDLE HYPO 22X1.5 SAFETY MO (MISCELLANEOUS) ×1 IMPLANT
NEEDLE INSUFFLATION 14GA 120MM (NEEDLE) ×1 IMPLANT
NS IRRIG 500ML POUR BTL (IV SOLUTION) ×1 IMPLANT
OBTURATOR OPTICALSTD 8 DVNC (TROCAR) ×1 IMPLANT
PACK LAP CHOLECYSTECTOMY (MISCELLANEOUS) ×1 IMPLANT
SEAL UNIV 5-12 XI (MISCELLANEOUS) ×4 IMPLANT
SET TUBE SMOKE EVAC HIGH FLOW (TUBING) ×1 IMPLANT
SOLUTION ELECTROSURG ANTI STCK (MISCELLANEOUS) ×1 IMPLANT
SPIKE FLUID TRANSFER (MISCELLANEOUS) ×2 IMPLANT
SPONGE T-LAP 4X18 ~~LOC~~+RFID (SPONGE) IMPLANT
SUT VICRYL 0 UR6 27IN ABS (SUTURE) ×1 IMPLANT
SUTURE MNCRL 4-0 27XMF (SUTURE) ×1 IMPLANT
SYSTEM BAG RETRIEVAL 10MM (BASKET) ×1 IMPLANT
TRAP FLUID SMOKE EVACUATOR (MISCELLANEOUS) IMPLANT
WATER STERILE IRR 500ML POUR (IV SOLUTION) ×1 IMPLANT

## 2023-10-22 NOTE — Transfer of Care (Signed)
 Immediate Anesthesia Transfer of Care Note  Patient: Sonya Knapp  Procedure(s) Performed: CHOLECYSTECTOMY, ROBOT-ASSISTED, LAPAROSCOPIC  Patient Location: PACU  Anesthesia Type:General  Level of Consciousness: awake and alert   Airway & Oxygen Therapy: Patient Spontanous Breathing and Patient connected to face mask oxygen  Post-op Assessment: Report given to RN, Post -op Vital signs reviewed and stable, and Patient moving all extremities  Post vital signs: stable  Last Vitals:  Vitals Value Taken Time  BP 108/54 10/22/23 15:17  Temp 36.2 C 10/22/23 15:17  Pulse 80 10/22/23 15:23  Resp 14 10/22/23 15:23  SpO2 100 % 10/22/23 15:23  Vitals shown include unfiled device data.  Last Pain:  Vitals:   10/22/23 1316  TempSrc: Tympanic  PainSc: 0-No pain         Complications: No notable events documented.

## 2023-10-22 NOTE — Anesthesia Preprocedure Evaluation (Signed)
 Anesthesia Evaluation  Patient identified by MRN, date of birth, ID band Patient awake    Reviewed: Allergy & Precautions, H&P , NPO status , Patient's Chart, lab work & pertinent test results, reviewed documented beta blocker date and time   Airway Mallampati: II  TM Distance: >3 FB Neck ROM: full    Dental  (+) Dental Advidsory Given, Teeth Intact Permanent retainer on the bottom:   Pulmonary shortness of breath and with exertion, neg sleep apnea, neg COPD, neg recent URI   Pulmonary exam normal breath sounds clear to auscultation       Cardiovascular Exercise Tolerance: Good negative cardio ROS Normal cardiovascular exam Rhythm:regular Rate:Normal     Neuro/Psych  PSYCHIATRIC DISORDERS Anxiety Depression    negative neurological ROS     GI/Hepatic Neg liver ROS,GERD  ,,  Endo/Other  diabetes  Class 4 obesity  Renal/GU negative Renal ROS  negative genitourinary   Musculoskeletal   Abdominal   Peds  Hematology  (+) Blood dyscrasia, anemia   Anesthesia Other Findings Past Medical History: No date: Anemia No date: Anxiety No date: Back pain No date: Chest pain No date: Depression No date: High blood pressure No date: PCOS (polycystic ovarian syndrome) No date: Pre-diabetes No date: Shortness of breath   Reproductive/Obstetrics negative OB ROS                              Anesthesia Physical Anesthesia Plan  ASA: 3  Anesthesia Plan: General   Post-op Pain Management:    Induction:   PONV Risk Score and Plan:   Airway Management Planned:   Additional Equipment:   Intra-op Plan:   Post-operative Plan:   Informed Consent: I have reviewed the patients History and Physical, chart, labs and discussed the procedure including the risks, benefits and alternatives for the proposed anesthesia with the patient or authorized representative who has indicated his/her understanding  and acceptance.     Dental Advisory Given  Plan Discussed with: Anesthesiologist, CRNA and Surgeon  Anesthesia Plan Comments:          Anesthesia Quick Evaluation

## 2023-10-22 NOTE — Op Note (Signed)
 Preoperative diagnosis: Gallstone pancreatitis   Postoperative diagnosis: Gallstone pancreatitis  Procedure: Robotic Assisted Laparoscopic Cholecystectomy.   Anesthesia: GETA   Surgeon: Dr. Cesar Coe  Wound Classification: Clean Contaminated  Indications: Patient is a 23 y.o. female developed upper abdominal pains and on workup was found to have cholelithiasis with biliary pancreatitis. MRCP negative for retained stone on common bile duct. Robotic Assisted Laparoscopic cholecystectomy was elected.  Findings:  Critical view of safety achieved Cystic duct and artery identified, ligated and divided Adequate hemostasis  Description of procedure: The patient was placed on the operating table in the supine position. General anesthesia was induced. A time-out was completed verifying correct patient, procedure, site, positioning, and implant(s) and/or special equipment prior to beginning this procedure. An orogastric tube was placed. The abdomen was prepped and draped in the usual sterile fashion.  An incision was made in a natural skin line below the umbilicus.  The fascia was elevated and the Veress needle inserted. Proper position was confirmed by aspiration and saline meniscus test.  The abdomen was insufflated with carbon dioxide to a pressure of 15 mmHg. The patient tolerated insufflation well. A 8-mm trocar was then inserted in optiview fashion.  The laparoscope was inserted and the abdomen inspected. No injuries from initial trocar placement were noted. Additional trocars were then inserted in the following locations: an 8-mm trocar in the left lateral abdomen, and another two 8-mm trocars to the right side of the abdomen 5 cm appart. The umbilical trocar was changed to a 12 mm trocar all under direct visualization. The abdomen was inspected and no abnormalities were found. The table was placed in the reverse Trendelenburg position with the right side up. The robotic arms were docked and  target anatomy identified. Instrument inserted under direct visualization.  Filmy adhesions between the gallbladder and omentum, duodenum and transverse colon were lysed with electrocautery. The dome of the gallbladder was grasped with a prograsp and retracted over the dome of the liver. The infundibulum was also grasped with an atraumatic grasper and retracted toward the right lower quadrant. This maneuver exposed Calot's triangle. The peritoneum overlying the gallbladder infundibulum was then incised and the cystic duct and cystic artery identified and circumferentially dissected. Critical view of safety reviewed before ligating any structure. Firefly images taken to visualize biliary ducts. The cystic duct and cystic artery were then doubly clipped and divided close to the gallbladder.  The gallbladder was then dissected from its peritoneal attachments by electrocautery. Hemostasis was checked and the gallbladder and contained stones were removed using an endoscopic retrieval bag. The gallbladder was passed off the table as a specimen. There was no evidence of bleeding from the gallbladder fossa or cystic artery or leakage of the bile from the cystic duct stump. Secondary trocars were removed under direct vision. No bleeding was noted. The robotic arms were undoked. The scope was withdrawn and the umbilical trocar removed. The abdomen was allowed to collapse. The fascia of the 12mm trocar sites was closed with figure-of-eight 0 vicryl sutures. The skin was closed with subcuticular sutures of 4-0 monocryl and topical skin adhesive. The orogastric tube was removed.  The patient tolerated the procedure well and was taken to the postanesthesia care unit in stable condition.   Specimen: Gallbladder  Complications: None  EBL: 5 mL

## 2023-10-22 NOTE — Progress Notes (Signed)
 PROGRESS NOTE    Sonya Knapp  FMW:[id number] DOB: 10-Sep-2000 DOA: 10/21/2023 PCP: Pcp, No  Chief Complaint  Patient presents with   Chest Pain   Emesis    Hospital Course:  Sonya Knapp is a 23 y.o. female with medical history significant of prediabetes, PCOS, depression with anxiety, morbid obesity, anemia, who presents with nausea, vomiting, epigastric abdominal pain and lower chest pain. Admitted for management of gall stone pancreatitis, Hospital course as below  Subjective: Was examined at bedside, new to me today.  States continues to have nausea but no emesis, also has mild epigastric and midsternal chest pain. Patient's boyfriend Ozell also present at the bedside Discussed updates and answered all questions   Objective: Vitals:   10/21/23 2351 10/22/23 0010 10/22/23 0358 10/22/23 0748  BP: 123/67  (!) 96/58 116/63  Pulse: 69  76 64  Resp:   17 18  Temp: 98.2 F (36.8 C)  98.1 F (36.7 C) 97.9 F (36.6 C)  TempSrc: Oral  Oral Oral  SpO2: 100%  99% 100%  Weight:  (!) 161.2 kg    Height:  5' 6 (1.676 m)      Intake/Output Summary (Last 24 hours) at 10/22/2023 0846 Last data filed at 10/22/2023 0600 Gross per 24 hour  Intake 738.06 ml  Output --  Net 738.06 ml   Filed Weights   10/22/23 0010  Weight: (!) 161.2 kg    Examination: General: Not in acute distress, mild jaundice Cardiac: S1/S2, RRR, No murmurs, No gallops or rubs. Respiratory: No rales, wheezing, rhonchi or rubs GI: Soft, nondistended, has tenderness in the upper abdomen, no rebound pain, no organomegaly, BS present. Ext: No pitting leg edema bilaterally. 1+DP/PT pulse bilaterally. Musculoskeletal: No joint deformities, No joint redness or warmth, no limitation of ROM in spin. Skin: No rashes.  Neuro: Alert, oriented X3, no gross focal deficits  Assessment & Plan:  Acute gallstone pancreatitis - Denies alcohol use, TG not elevated - Lipase 229. RUQ-US  showed cholelithiasis without  sonographic evidence of acute cholecystitis. Common bile duct measures 5 mm - MRCP with no acute abnormality, no biliary ductal dilation. CBD 4mm - Seen by general Surgery, appreciate recs. Plan for cholecystectomy today - NPO, IV fluids - Pain control: As needed morphine, oxycodone  Abnormal LFTs - improving - Likely due to gallstone, which may have passed.  Ultrasound showed hepatic steatosis.  Patient does not drink alcohol. - Trend LFT's  Atypical chest pain: Her chest pain seems to be radiated from her upper abdominal pain - Troponin x 1 negative - Pain control as above   Prediabetes: Recent A1c 5 point 09/06/2022.  Patient's not taking medications currently   Microcytic anemia: Hemoglobin stable 11.5 (10.6 on 08/09/2022) -Follow-up CBC   Morbid obesity: The patient was just seen by family medicine on 10/9 for follow-up of her chronic conditions and review of her obesity treatment plan. Patient has Obesity Class III, BMI 57.36 - Encouraged losing weight - Exercise and healthy diet   DVT prophylaxis: Heparin SQ   Code Status: Full Code Disposition:  TBD  Consultants:  General Surgery  Procedures:  Laparoscopic cholecystectomy 10/120  Antimicrobials:  Anti-infectives (From admission, onward)    None       Data Reviewed: I have personally reviewed following labs and imaging studies CBC: Recent Labs  Lab 10/21/23 1938 10/22/23 0528  WBC 5.2 4.6  HGB 11.5* 11.0*  HCT 36.5 35.1*  MCV 76.4* 75.6*  PLT 328 294   Basic Metabolic Panel:  Recent Labs  Lab 10/21/23 1938 10/22/23 0528  NA 136 137  K 3.8 3.6  CL 103 105  CO2 21* 22  GLUCOSE 80 90  BUN 15 14  CREATININE 0.69 0.72  CALCIUM 8.9 8.4*   GFR: Estimated Creatinine Clearance: 174.3 mL/min (by C-G formula based on SCr of 0.72 mg/dL). Liver Function Tests: Recent Labs  Lab 10/21/23 1938 10/22/23 0528  AST 582* 386*  ALT 674* 571*  ALKPHOS 211* 189*  BILITOT 3.9* 4.1*  PROT 7.6 6.9  ALBUMIN 3.6  3.2*   CBG: No results for input(s): GLUCAP in the last 168 hours.  No results found for this or any previous visit (from the past 240 hours).   Radiology Studies: MR ABDOMEN MRCP W WO CONTAST Result Date: 10/22/2023 CLINICAL DATA:  Right upper quadrant abdominal pain, chest pain and emesis since yesterday. EXAM: MRI ABDOMEN WITHOUT AND WITH CONTRAST (INCLUDING MRCP) TECHNIQUE: Multiplanar multisequence MR imaging of the abdomen was performed both before and after the administration of intravenous contrast. Heavily T2-weighted images of the biliary and pancreatic ducts were obtained, and three-dimensional MRCP images were rendered by post processing. CONTRAST:  10mL GADAVIST GADOBUTROL 1 MMOL/ML IV SOLN COMPARISON:  Ultrasound October 21, 2023 FINDINGS: Lower chest: No acute abnormality. Hepatobiliary: No significant hepatic steatosis or iron  deposition. No suspicious hepatic lesion. Gallbladder is unremarkable. No biliary ductal dilation. The common bile duct measures 4 mm in diameter. Pancreas: No pancreatic ductal dilation or evidence of acute inflammation. Spleen:  No splenomegaly. Adrenals/Urinary Tract: No suspicious adrenal nodule/mass. No hydronephrosis. Kidneys demonstrate symmetric enhancement. Stomach/Bowel: Stomach is distended with ingested material without focal wall thickening. No pathologic dilation of small or large bowel in the abdomen. Vascular/Lymphatic: No pathologically enlarged lymph nodes identified. No abdominal aortic aneurysm demonstrated. Other:  None. Musculoskeletal: No suspicious bone lesions identified. IMPRESSION: 1. No acute abnormality in the abdomen. 2. No biliary ductal dilation. Electronically Signed   By: Reyes Holder M.D.   On: 10/22/2023 03:07   MR 3D Recon At Scanner Result Date: 10/22/2023 CLINICAL DATA:  Right upper quadrant abdominal pain, chest pain and emesis since yesterday. EXAM: MRI ABDOMEN WITHOUT AND WITH CONTRAST (INCLUDING MRCP) TECHNIQUE:  Multiplanar multisequence MR imaging of the abdomen was performed both before and after the administration of intravenous contrast. Heavily T2-weighted images of the biliary and pancreatic ducts were obtained, and three-dimensional MRCP images were rendered by post processing. CONTRAST:  10mL GADAVIST GADOBUTROL 1 MMOL/ML IV SOLN COMPARISON:  Ultrasound October 21, 2023 FINDINGS: Lower chest: No acute abnormality. Hepatobiliary: No significant hepatic steatosis or iron  deposition. No suspicious hepatic lesion. Gallbladder is unremarkable. No biliary ductal dilation. The common bile duct measures 4 mm in diameter. Pancreas: No pancreatic ductal dilation or evidence of acute inflammation. Spleen:  No splenomegaly. Adrenals/Urinary Tract: No suspicious adrenal nodule/mass. No hydronephrosis. Kidneys demonstrate symmetric enhancement. Stomach/Bowel: Stomach is distended with ingested material without focal wall thickening. No pathologic dilation of small or large bowel in the abdomen. Vascular/Lymphatic: No pathologically enlarged lymph nodes identified. No abdominal aortic aneurysm demonstrated. Other:  None. Musculoskeletal: No suspicious bone lesions identified. IMPRESSION: 1. No acute abnormality in the abdomen. 2. No biliary ductal dilation. Electronically Signed   By: Reyes Holder M.D.   On: 10/22/2023 03:07   US  Abdomen Limited RUQ (LIVER/GB) Result Date: 10/21/2023 EXAM: Right Upper Quadrant Abdominal Ultrasound 10/21/2023 09:42:24 PM TECHNIQUE: Real-time ultrasonography of the right upper quadrant of the abdomen was performed. COMPARISON: None available. CLINICAL HISTORY: 221910 Elevated LFTs 221910.  Elevated LFTs. FINDINGS: LIVER: Hepatic steatosis. Patent portal vein with antegrade flow. No intrahepatic biliary ductal dilatation. No evidence of mass. BILIARY SYSTEM: Cholelithiasis in the gallbladder fundus. No wall thickening or pericholecystic fluid. Negative sonographic Murphy's sign. Common bile duct  measures 5 mm. OTHER: No right upper quadrant ascites. IMPRESSION: 1. Cholelithiasis without sonographic evidence of acute cholecystitis. 2. Hepatic steatosis. Electronically signed by: Norman Gatlin MD 10/21/2023 09:55 PM EDT RP Workstation: [id number]   DG Chest 2 View Result Date: 10/21/2023 CLINICAL DATA:  Chest pain and shortness of breath. EXAM: CHEST - 2 VIEW COMPARISON:  Chest pain and did 11/05/2019. FINDINGS: The heart size and mediastinal contours are within normal limits. Both lungs are clear. The visualized skeletal structures are unremarkable. IMPRESSION: No active cardiopulmonary disease. Electronically Signed   By: Vanetta Chou M.D.   On: 10/21/2023 20:35    Scheduled Meds:  heparin  5,000 Units Subcutaneous Q8H   Continuous Infusions:  sodium chloride  125 mL/hr at 10/22/23 0005     LOS: 1 day  MDM: Patient is high risk for one or more organ failure.  They necessitate ongoing hospitalization for continued IV therapies and subsequent lab monitoring. Total time spent interpreting labs and vitals, reviewing the medical record, coordinating care amongst consultants and care team members, directly assessing and discussing care with the patient and/or family: 55 min Laree Lock, MD [facility]  To contact the attending physician between 7A-7P please use Epic Chat. To contact the covering physician during after hours 7P-7A, please review Amion.  10/22/2023, 8:46 AM   *This document has been created with the assistance of dictation software. Please excuse typographical errors. *

## 2023-10-22 NOTE — Plan of Care (Signed)

## 2023-10-22 NOTE — H&P (Signed)
 [facility]- General Surgery SURGICAL HISTORY & PHYSICAL   HISTORY OF PRESENT ILLNESS (HPI):  23 y.o. female with past medical history of pre-diabetes, GERD, and obesity, presented to [facility] ED yesterday with epigastric pain radiating to chest x 2 days. Patient reports associated nausea and vomiting. Pain started after eating spicy food this past Saturday, describes the pain as intermittent with sudden sharp pains. Had a similar episode previously which eventually resolved on its own. Pain is aggravated with certain positions such as laying flat or standing straight.  No known alleviating factors. Denies any fevers, chills, hematemesis, diarrhea, constipation, or any changes to bowel habits.  No previous abdominal surgeries.   In the ED, patient had a temp of 99.6 F, BP 149/104, HR 68, and RR 18. Labs indicated no leukocytosis. AST 582, ALT 674, alkaline phos 211, and total bili 3.9, direct bili 2.2, and direct bili 1.7. Lipase 229.  Ultrasound abdomen showed cholelithiasis with no evidence of acute cholecystitis.  An MRCP was obtained given elevated LFTs, bilirubin, and lipase. MRCP was negative for choledocholithiasis this morning.  Patient states pain has been improving.  Denies any recent vomiting. Patient has not had anything to eat or drink since yesterday. Labs are improving. Patient did not know she had gallstones.    PAST MEDICAL HISTORY (PMH):  Past Medical History:  Diagnosis Date   Anemia    Anxiety    Back pain    Chest pain    Depression    High blood pressure    PCOS (polycystic ovarian syndrome)    Pre-diabetes    Shortness of breath     Reviewed. Otherwise negative.   PAST SURGICAL HISTORY (PSH):  History reviewed. No pertinent surgical history.  Reviewed. Otherwise negative.   MEDICATIONS:  Prior to Admission medications   Medication Sig Start Date End Date Taking? Authorizing Provider  Iron , Ferrous Sulfate , 325 (65 Fe) MG TABS Pt takes 3 tabs QD 10/11/22    Verdon Parry D, MD  metFORMIN  (GLUCOPHAGE ) 500 MG tablet Take 1 tablet (500 mg total) by mouth 2 (two) times daily with a meal. Lunch and dinner. 09/18/22 10/18/22  Midge Sober, DO  norgestimate -ethinyl estradiol  (SPRINTEC 28) 0.25-35 MG-MCG tablet Take 1 tablet by mouth daily. 06/29/21   Ward, Josette SAILOR, DO  Vitamin D , Ergocalciferol , (DRISDOL ) 1.25 MG (50000 UNIT) CAPS capsule Take 1 capsule (50,000 Units total) by mouth every 7 (seven) days. 10/11/22   Verdon Parry BIRCH, MD     ALLERGIES:  No Known Allergies   SOCIAL HISTORY:  Social History   Socioeconomic History   Marital status: Single    Spouse name: Not on file   Number of children: Not on file   Years of education: Not on file   Highest education level: Not on file  Occupational History   Not on file  Tobacco Use   Smoking status: Never   Smokeless tobacco: Never  Vaping Use   Vaping status: Never Used  Substance and Sexual Activity   Alcohol use: No    Alcohol/week: 0.0 standard drinks of alcohol   Drug use: Never   Sexual activity: Not on file  Other Topics Concern   Not on file  Social History Narrative   Not on file   Social Drivers of Health   Financial Resource Strain: Low Risk  (12/15/2021)   Received from [facility]   Overall Financial Resource Strain (CARDIA)    Difficulty of Paying Living Expenses: Not hard at all  Food Insecurity: No Food Insecurity (10/22/2023)   Hunger Vital Sign    Worried About Running Out of Food in the Last Year: Never true    Ran Out of Food in the Last Year: Never true  Transportation Needs: No Transportation Needs (10/22/2023)   PRAPARE - Administrator, Civil Service (Medical): No    Lack of Transportation (Non-Medical): No  Physical Activity: Not on file  Stress: Not on file  Social Connections: Not on file  Intimate Partner Violence: Not At Risk (10/22/2023)   Humiliation, Afraid, Rape, and Kick questionnaire    Fear of Current or  Ex-Partner: No    Emotionally Abused: No    Physically Abused: No    Sexually Abused: No     FAMILY HISTORY:  Family History  Problem Relation Age of Onset   Obesity Mother    Hypertension Mother    Depression Father     Otherwise negative.   REVIEW OF SYSTEMS:  Review of Systems  Constitutional:  Negative for chills and fever.  Respiratory:  Negative for shortness of breath and wheezing.   Cardiovascular:  Positive for chest pain. Negative for palpitations.  Gastrointestinal:  Positive for abdominal pain, nausea and vomiting. Negative for constipation and diarrhea.    VITAL SIGNS:  Temp:  [97.9 F (36.6 C)-99.6 F (37.6 C)] 97.9 F (36.6 C) (10/20 0748) Pulse Rate:  [64-76] 64 (10/20 0748) Resp:  [15-19] 18 (10/20 0748) BP: (94-149)/(58-104) 116/63 (10/20 0748) SpO2:  [96 %-100 %] 100 % (10/20 0748) Weight:  [161.2 kg] 161.2 kg (10/20 0010)     Height: 5' 6 (167.6 cm) Weight: (!) 161.2 kg BMI (Calculated): 57.39   PHYSICAL EXAM:  Physical Exam Constitutional:      Appearance: She is well-developed. She is obese.  HENT:     Head: Normocephalic and atraumatic.  Eyes:     Extraocular Movements: Extraocular movements intact.     Pupils: Pupils are equal, round, and reactive to light.  Cardiovascular:     Rate and Rhythm: Normal rate and regular rhythm.  Pulmonary:     Effort: Pulmonary effort is normal.     Breath sounds: Normal breath sounds.  Abdominal:     Palpations: Abdomen is soft.     Tenderness: There is abdominal tenderness (across upper abdomen). There is no guarding.  Neurological:     Mental Status: She is alert.     INTAKE/OUTPUT:  This shift: No intake/output data recorded.  Last 2 shifts: @IOLAST2SHIFTS @  Labs:     Latest Ref Rng & Units 10/22/2023    5:28 AM 10/21/2023    7:38 PM 08/09/2022    3:10 PM  CBC  WBC 4.0 - 10.5 K/uL 4.6  5.2  6.4   Hemoglobin 12.0 - 15.0 g/dL 88.9  88.4  89.3   Hematocrit 36.0 - 46.0 % 35.1  36.5  34.7    Platelets 150 - 400 K/uL 294  328  413       Latest Ref Rng & Units 10/22/2023    5:28 AM 10/21/2023    7:38 PM 08/09/2022    3:10 PM  CMP  Glucose 70 - 99 mg/dL 90  80  97   BUN 6 - 20 mg/dL 14  15  12    Creatinine 0.44 - 1.00 mg/dL 9.27  9.30  9.17   Sodium 135 - 145 mmol/L 137  136  137   Potassium 3.5 - 5.1 mmol/L 3.6  3.8  4.4  Chloride 98 - 111 mmol/L 105  103  102   CO2 22 - 32 mmol/L 22  21  19    Calcium 8.9 - 10.3 mg/dL 8.4  8.9  8.6   Total Protein 6.5 - 8.1 g/dL 6.9  7.6  7.0   Total Bilirubin 0.0 - 1.2 mg/dL 4.1  3.9  0.2   Alkaline Phos 38 - 126 U/L 189  211  83   AST 15 - 41 U/L 386  582  13   ALT 0 - 44 U/L 571  674  19     Imaging studies:    EXAM: Right Upper Quadrant Abdominal Ultrasound 10/21/2023 09:42:24 PM   TECHNIQUE: Real-time ultrasonography of the right upper quadrant of the abdomen was performed.   COMPARISON: None available.   CLINICAL HISTORY: 221910 Elevated LFTs 221910. Elevated LFTs.   FINDINGS:   LIVER: Hepatic steatosis. Patent portal vein with antegrade flow. No intrahepatic biliary ductal dilatation. No evidence of mass.   BILIARY SYSTEM: Cholelithiasis in the gallbladder fundus. No wall thickening or pericholecystic fluid. Negative sonographic Murphy's sign. Common bile duct measures 5 mm.   OTHER: No right upper quadrant ascites.   IMPRESSION: 1. Cholelithiasis without sonographic evidence of acute cholecystitis. 2. Hepatic steatosis.   Electronically signed by: Norman Gatlin MD 10/21/2023 09:55 PM EDT RP Workstation: [id number]   CLINICAL DATA:  Right upper quadrant abdominal pain, chest pain and emesis since yesterday.   EXAM: 10/22/23 MRI ABDOMEN WITHOUT AND WITH CONTRAST (INCLUDING MRCP)   TECHNIQUE: Multiplanar multisequence MR imaging of the abdomen was performed both before and after the administration of intravenous contrast. Heavily T2-weighted images of the biliary and pancreatic ducts  were obtained, and three-dimensional MRCP images were rendered by post processing.   CONTRAST:  10mL GADAVIST GADOBUTROL 1 MMOL/ML IV SOLN   COMPARISON:  Ultrasound October 21, 2023   FINDINGS: Lower chest: No acute abnormality.   Hepatobiliary: No significant hepatic steatosis or iron  deposition. No suspicious hepatic lesion. Gallbladder is unremarkable. No biliary ductal dilation. The common bile duct measures 4 mm in diameter.   Pancreas: No pancreatic ductal dilation or evidence of acute inflammation.   Spleen:  No splenomegaly.   Adrenals/Urinary Tract: No suspicious adrenal nodule/mass. No hydronephrosis. Kidneys demonstrate symmetric enhancement.   Stomach/Bowel: Stomach is distended with ingested material without focal wall thickening. No pathologic dilation of small or large bowel in the abdomen.   Vascular/Lymphatic: No pathologically enlarged lymph nodes identified. No abdominal aortic aneurysm demonstrated.   Other:  None.   Musculoskeletal: No suspicious bone lesions identified.   IMPRESSION: 1. No acute abnormality in the abdomen. 2. No biliary ductal dilation.     Electronically Signed   By: Reyes Holder M.D.   On: 10/22/2023 03:07  Assessment/Plan:  23 y.o. female with gallstone pancreatitis, complicated by pertinent comorbidities including pre-diabetes, obesity with BMI 57 kg, and GERD.    - Patient is overall stable, no fever or tachycardic, with improving abdominal pain. No leukocytosis. No evidence of acute cholecystitis or choledocholithiasis on imaging. Lipase levels improving 229 >> 81, as well as LFTs.  - Discussed the recommendation of surgery to prevent reoccurrence of gallstone pancreatitis or other complications such as acute cholecystitis, choledocholithiasis, or cholangitis.  Discussed robotic assisted laparoscopic cholecystectomy in detail with patient. Discussed risks of surgery including bleeding, infection, and injury to nearby  organs or structures.  Patient shows understanding and would like to proceed with surgery.  - Will plan for surgery this afternoon. Continue  NPO  - Continue pain management  - DVT prophylaxis  -- Mickenzie Stolar Barrientos PA-C

## 2023-10-22 NOTE — Anesthesia Procedure Notes (Addendum)
 Procedure Name: Intubation Date/Time: 10/22/2023 2:05 PM  Performed by: Veronica Alm BROCKS, CRNAPre-anesthesia Checklist: Patient identified, Emergency Drugs available, Suction available and Patient being monitored Patient Re-evaluated:Patient Re-evaluated prior to induction Oxygen Delivery Method: Circle system utilized Preoxygenation: Pre-oxygenation with 100% oxygen Induction Type: IV induction Ventilation: Mask ventilation without difficulty Laryngoscope Size: McGrath and 3 Grade View: Grade I Tube type: Oral Tube size: 7.0 mm Number of attempts: 1 Airway Equipment and Method: Stylet and Oral airway Placement Confirmation: ETT inserted through vocal cords under direct vision, positive ETCO2 and breath sounds checked- equal and bilateral Secured at: 22 cm Tube secured with: Tape Dental Injury: Teeth and Oropharynx as per pre-operative assessment  Comments: Atraumatic

## 2023-10-23 ENCOUNTER — Other Ambulatory Visit: Payer: Self-pay

## 2023-10-23 LAB — CBC
HCT: 38.7 % (ref 36.0–46.0)
Hemoglobin: 12.1 g/dL (ref 12.0–15.0)
MCH: 23.8 pg — ABNORMAL LOW (ref 26.0–34.0)
MCHC: 31.3 g/dL (ref 30.0–36.0)
MCV: 76 fL — ABNORMAL LOW (ref 80.0–100.0)
Platelets: 366 K/uL (ref 150–400)
RBC: 5.09 MIL/uL (ref 3.87–5.11)
RDW: 16.3 % — ABNORMAL HIGH (ref 11.5–15.5)
WBC: 10.2 K/uL (ref 4.0–10.5)
nRBC: 0 % (ref 0.0–0.2)

## 2023-10-23 LAB — COMPREHENSIVE METABOLIC PANEL WITH GFR
ALT: 423 U/L — ABNORMAL HIGH (ref 0–44)
AST: 159 U/L — ABNORMAL HIGH (ref 15–41)
Albumin: 3.3 g/dL — ABNORMAL LOW (ref 3.5–5.0)
Alkaline Phosphatase: 188 U/L — ABNORMAL HIGH (ref 38–126)
Anion gap: 11 (ref 5–15)
BUN: 13 mg/dL (ref 6–20)
CO2: 21 mmol/L — ABNORMAL LOW (ref 22–32)
Calcium: 8.7 mg/dL — ABNORMAL LOW (ref 8.9–10.3)
Chloride: 103 mmol/L (ref 98–111)
Creatinine, Ser: 0.89 mg/dL (ref 0.44–1.00)
GFR, Estimated: >60 mL/min (ref >60)
Glucose, Bld: 99 mg/dL (ref 70–99)
Potassium: 3.9 mmol/L (ref 3.5–5.1)
Sodium: 135 mmol/L (ref 135–145)
Total Bilirubin: 1.4 mg/dL — ABNORMAL HIGH (ref 0.0–1.2)
Total Protein: 7.8 g/dL (ref 6.5–8.1)

## 2023-10-23 MED ORDER — OXYCODONE HCL 5 MG PO TABS
5.0000 mg | ORAL_TABLET | Freq: Four times a day (QID) | ORAL | 0 refills | Status: AC | PRN
  Filled 2023-10-23: qty 8, 2d supply, fill #0

## 2023-10-23 NOTE — Discharge Instructions (Signed)

## 2023-10-23 NOTE — Progress Notes (Signed)
 [facility]- General Surgery  SURGICAL PROGRESS NOTE  Hospital Day(s): 2.   Post op day(s): 1 Day Post-Op.   Interval History:  Patient was seen and examined this morning.  Admits to feeling sore near incision but overall abdominal pain has improved since admission. Reports feeling lightheaded and nauseous when getting out of bed. Feels like she has had poor water intake. Had been experiencing the symptoms before surgery. She has otherwise tolerated regular food.  Denies any nausea or vomiting after eating last night. LFTs and total bilirubin improving.  Vital signs in last 24 hours: [min-max] current  Temp:  [96.8 F (36 C)-99.3 F (37.4 C)] 99.3 F (37.4 C) (10/21 0815) Pulse Rate:  [63-87] 71 (10/21 0815) Resp:  [10-28] 18 (10/21 0815) BP: (101-133)/(54-91) 101/65 (10/21 0815) SpO2:  [94 %-100 %] 100 % (10/21 0815) Weight:  [154.2 kg] 154.2 kg (10/20 1316)     Height: 5' 6 (167.6 cm) Weight: (!) 154.2 kg BMI (Calculated): 54.9   Intake/Output last 2 shifts:  10/20 0701 - 10/21 0700 In: 2538.4 [P.O.:90; I.V.:2298.4; IV Piggyback:150] Out: 5 [Blood:5]   Physical Exam:  Constitutional: alert, cooperative and no distress  Respiratory: breathing non-labored at rest  Cardiovascular: regular rate and sinus rhythm  Gastrointestinal: soft, mildly tender near incision, and non-distended, surgical incisions are clean and dry  Labs:     Latest Ref Rng & Units 10/23/2023    4:18 AM 10/22/2023    5:28 AM 10/21/2023    7:38 PM  CBC  WBC 4.0 - 10.5 K/uL 10.2  4.6  5.2   Hemoglobin 12.0 - 15.0 g/dL 87.8  88.9  88.4   Hematocrit 36.0 - 46.0 % 38.7  35.1  36.5   Platelets 150 - 400 K/uL 366  294  328       Latest Ref Rng & Units 10/23/2023    4:18 AM 10/22/2023    5:28 AM 10/21/2023    7:38 PM  CMP  Glucose 70 - 99 mg/dL 99  90  80   BUN 6 - 20 mg/dL 13  14  15    Creatinine 0.44 - 1.00 mg/dL 9.10  9.27  9.30   Sodium 135 - 145 mmol/L 135  137  136   Potassium 3.5 - 5.1  mmol/L 3.9  3.6  3.8   Chloride 98 - 111 mmol/L 103  105  103   CO2 22 - 32 mmol/L 21  22  21    Calcium 8.9 - 10.3 mg/dL 8.7  8.4  8.9   Total Protein 6.5 - 8.1 g/dL 7.8  6.9  7.6   Total Bilirubin 0.0 - 1.2 mg/dL 1.4  4.1  3.9   Alkaline Phos 38 - 126 U/L 188  189  211   AST 15 - 41 U/L 159  386  582   ALT 0 - 44 U/L 423  571  674     Imaging studies: No new pertinent imaging studies   Assessment/Plan:  23 y.o. female with gallstone pancreatitis 1 Day Post-Op s/p robotic assisted laparoscopic cholecystectomy complicated by pertinent comorbidities including pre-diabetes, obesity with BMI 57 kg, and GERD.    - No fever and not tachycardic    - No leukocytosis    - Clinical presentation and labs results including LFTs and total bili improving.  No concerns for postoperative complications. Patient is tolerating diet and abdominal pain is improving. If patient continues to do well this morning, she will be clear from surgical standpoint and  can be discharged once medically stable.  -- Gilmer Cea PA-C

## 2023-10-23 NOTE — Discharge Summary (Addendum)
 Physician Discharge Summary   Patient: Sonya Knapp MRN: [id number] DOB: [birth year]  Admit date:     10/21/2023  Discharge date: 10/23/23  Discharge Physician: Laree Lock   PCP: Pcp, No   Recommendations at discharge:   Follow-up with PCP in 1 week - Repeat CBC, CMP  Discharge Diagnoses: Principal Problem:   Acute gallstone pancreatitis Active Problems:   Abnormal LFTs   Atypical chest pain   Prediabetes   Microcytic anemia   Morbid obesity [facility])  Hospital Course: Sonya Knapp is a 23 y.o. female with medical history significant of prediabetes, PCOS, depression with anxiety, morbid obesity, anemia presented with nausea/vomiting and epigastric abdominal pain.  Admitted for management of gall stone pancreatitis, Hospital course as below  Acute gallstone pancreatitis - Denies alcohol use, TG not elevated - Lipase 229. RUQ-US  showed cholelithiasis without sonographic evidence of acute cholecystitis. Common bile duct measures 5 mm - MRCP with no acute abnormality, no biliary ductal dilation. CBD 4mm - Seen by general Surgery, s/p laparoscopic cholecystectomy 10/20 - Clinically improved, tolerating diet. Discharged on po Oxycodone (8tabs). Avoid tylenol due to elevated LFT's  Addendum: Pathology shows acute cholecystitis with cholelithiasis   Abnormal LFTs - improving - Likely due to gallstone, which may have passed.  Ultrasound showed hepatic steatosis.  Patient does not drink alcohol. - Trend LFT's   Atypical chest pain: Her chest pain seems to be radiated from her upper abdominal pain - resolved - Troponin x 2 negative   Prediabetes: Recent A1c 5 point 09/06/2022   Microcytic anemia: Hemoglobin stable   Morbid obesity: The patient was just seen by family medicine on 10/9 for follow-up of her chronic conditions and review of her obesity treatment plan. Patient has Obesity Class III, BMI 57.36 - Encouraged losing weight - Exercise and healthy diet   Consultants: General  Surgery Procedures performed: s/p laparoscopic cholecystectomy 10/20  Disposition: Home Diet recommendation:  Discharge Diet Orders (From admission, onward)     Start     Ordered   10/23/23 0000  Diet general       Comments: Low fat diet   10/23/23 1259            DISCHARGE MEDICATION: Allergies as of 10/23/2023   No Known Allergies      Medication List     TAKE these medications    ferrous sulfate  325 (65 FE) MG tablet Take 325 mg by mouth daily with breakfast.   metformin  500 MG (OSM) 24 hr tablet Commonly known as: FORTAMET  Take 500 mg by mouth daily with breakfast.   oxyCODONE 5 MG immediate release tablet Commonly known as: Oxy IR/ROXICODONE Take 1 tablet (5 mg total) by mouth every 6 (six) hours as needed for moderate pain (pain score 4-6) or severe pain (pain score 7-10).        Follow-up Information     Rodolph Romano, MD Follow up in 2 week(s).   Specialty: General Surgery Why: 2 weeks postop robotic assisted laparoscopic cholecystectomy Contact information: [address] [place] [address] [phone]                Discharge Exam: Fredricka Weights   10/22/23 0010 10/22/23 1316  Weight: (!) 161.2 kg (!) 154.2 kg   General: Not in acute distress Cardiac: S1/S2, RRR, No murmurs, No gallops or rubs. Respiratory: No rales, wheezing, rhonchi or rubs GI: Soft, nondistended, tenderness consistent post op Ext: No pitting leg edema bilaterally. 1+DP/PT pulse bilaterally. Musculoskeletal: No joint deformities, No  joint redness or warmth, no limitation of ROM in spin. Skin: No rashes.  Neuro: Alert, oriented X3, no gross focal deficits  Condition at discharge: good  The results of significant diagnostics from this hospitalization (including imaging, microbiology, ancillary and laboratory) are listed below for reference.   Imaging Studies: MR ABDOMEN MRCP W WO CONTAST Result Date: 10/22/2023 CLINICAL DATA:  Right upper  quadrant abdominal pain, chest pain and emesis since yesterday. EXAM: MRI ABDOMEN WITHOUT AND WITH CONTRAST (INCLUDING MRCP) TECHNIQUE: Multiplanar multisequence MR imaging of the abdomen was performed both before and after the administration of intravenous contrast. Heavily T2-weighted images of the biliary and pancreatic ducts were obtained, and three-dimensional MRCP images were rendered by post processing. CONTRAST:  10mL GADAVIST GADOBUTROL 1 MMOL/ML IV SOLN COMPARISON:  Ultrasound October 21, 2023 FINDINGS: Lower chest: No acute abnormality. Hepatobiliary: No significant hepatic steatosis or iron  deposition. No suspicious hepatic lesion. Gallbladder is unremarkable. No biliary ductal dilation. The common bile duct measures 4 mm in diameter. Pancreas: No pancreatic ductal dilation or evidence of acute inflammation. Spleen:  No splenomegaly. Adrenals/Urinary Tract: No suspicious adrenal nodule/mass. No hydronephrosis. Kidneys demonstrate symmetric enhancement. Stomach/Bowel: Stomach is distended with ingested material without focal wall thickening. No pathologic dilation of small or large bowel in the abdomen. Vascular/Lymphatic: No pathologically enlarged lymph nodes identified. No abdominal aortic aneurysm demonstrated. Other:  None. Musculoskeletal: No suspicious bone lesions identified. IMPRESSION: 1. No acute abnormality in the abdomen. 2. No biliary ductal dilation. Electronically Signed   By: Reyes Holder M.D.   On: 10/22/2023 03:07   MR 3D Recon At Scanner Result Date: 10/22/2023 CLINICAL DATA:  Right upper quadrant abdominal pain, chest pain and emesis since yesterday. EXAM: MRI ABDOMEN WITHOUT AND WITH CONTRAST (INCLUDING MRCP) TECHNIQUE: Multiplanar multisequence MR imaging of the abdomen was performed both before and after the administration of intravenous contrast. Heavily T2-weighted images of the biliary and pancreatic ducts were obtained, and three-dimensional MRCP images were rendered by  post processing. CONTRAST:  10mL GADAVIST GADOBUTROL 1 MMOL/ML IV SOLN COMPARISON:  Ultrasound October 21, 2023 FINDINGS: Lower chest: No acute abnormality. Hepatobiliary: No significant hepatic steatosis or iron  deposition. No suspicious hepatic lesion. Gallbladder is unremarkable. No biliary ductal dilation. The common bile duct measures 4 mm in diameter. Pancreas: No pancreatic ductal dilation or evidence of acute inflammation. Spleen:  No splenomegaly. Adrenals/Urinary Tract: No suspicious adrenal nodule/mass. No hydronephrosis. Kidneys demonstrate symmetric enhancement. Stomach/Bowel: Stomach is distended with ingested material without focal wall thickening. No pathologic dilation of small or large bowel in the abdomen. Vascular/Lymphatic: No pathologically enlarged lymph nodes identified. No abdominal aortic aneurysm demonstrated. Other:  None. Musculoskeletal: No suspicious bone lesions identified. IMPRESSION: 1. No acute abnormality in the abdomen. 2. No biliary ductal dilation. Electronically Signed   By: Reyes Holder M.D.   On: 10/22/2023 03:07   US  Abdomen Limited RUQ (LIVER/GB) Result Date: 10/21/2023 EXAM: Right Upper Quadrant Abdominal Ultrasound 10/21/2023 09:42:24 PM TECHNIQUE: Real-time ultrasonography of the right upper quadrant of the abdomen was performed. COMPARISON: None available. CLINICAL HISTORY: 221910 Elevated LFTs 221910. Elevated LFTs. FINDINGS: LIVER: Hepatic steatosis. Patent portal vein with antegrade flow. No intrahepatic biliary ductal dilatation. No evidence of mass. BILIARY SYSTEM: Cholelithiasis in the gallbladder fundus. No wall thickening or pericholecystic fluid. Negative sonographic Murphy's sign. Common bile duct measures 5 mm. OTHER: No right upper quadrant ascites. IMPRESSION: 1. Cholelithiasis without sonographic evidence of acute cholecystitis. 2. Hepatic steatosis. Electronically signed by: Norman Gatlin MD 10/21/2023 09:55 PM EDT RP  Workstation: [id number]    DG Chest 2 View Result Date: 10/21/2023 CLINICAL DATA:  Chest pain and shortness of breath. EXAM: CHEST - 2 VIEW COMPARISON:  Chest pain and did 11/05/2019. FINDINGS: The heart size and mediastinal contours are within normal limits. Both lungs are clear. The visualized skeletal structures are unremarkable. IMPRESSION: No active cardiopulmonary disease. Electronically Signed   By: Vanetta Chou M.D.   On: 10/21/2023 20:35    Microbiology: Results for orders placed or performed during the hospital encounter of 11/08/19  Respiratory Panel by RT PCR (Flu A&B, Covid) - Nasopharyngeal Swab     Status: None   Collection Time: 11/08/19  3:14 PM   Specimen: Nasopharyngeal Swab  Result Value Ref Range Status   SARS Coronavirus 2 by RT PCR NEGATIVE NEGATIVE Final    Comment: (NOTE) SARS-CoV-2 target nucleic acids are NOT DETECTED.  The SARS-CoV-2 RNA is generally detectable in upper respiratoy specimens during the acute phase of infection. The lowest concentration of SARS-CoV-2 viral copies this assay can detect is 131 copies/mL. A negative result does not preclude SARS-Cov-2 infection and should not be used as the sole basis for treatment or other patient management decisions. A negative result may occur with  improper specimen collection/handling, submission of specimen other than nasopharyngeal swab, presence of viral mutation(s) within the areas targeted by this assay, and inadequate number of viral copies (<131 copies/mL). A negative result must be combined with clinical observations, patient history, and epidemiological information. The expected result is Negative.  Fact Sheet for Patients:  https://www.moore.com/  Fact Sheet for Healthcare Providers:  https://www.young.biz/  This test is no t yet approved or cleared by the [place]  FDA and  has been authorized for detection and/or diagnosis of SARS-CoV-2 by FDA under an Emergency Use  Authorization (EUA). This EUA will remain  in effect (meaning this test can be used) for the duration of the COVID-19 declaration under Section 564(b)(1) of the Act, 21 U.S.C. section 360bbb-3(b)(1), unless the authorization is terminated or revoked sooner.     Influenza A by PCR NEGATIVE NEGATIVE Final   Influenza B by PCR NEGATIVE NEGATIVE Final    Comment: (NOTE) The Xpert Xpress SARS-CoV-2/FLU/RSV assay is intended as an aid in  the diagnosis of influenza from Nasopharyngeal swab specimens and  should not be used as a sole basis for treatment. Nasal washings and  aspirates are unacceptable for Xpert Xpress SARS-CoV-2/FLU/RSV  testing.  Fact Sheet for Patients: https://www.moore.com/  Fact Sheet for Healthcare Providers: https://www.young.biz/  This test is not yet approved or cleared by the [place]  FDA and  has been authorized for detection and/or diagnosis of SARS-CoV-2 by  FDA under an Emergency Use Authorization (EUA). This EUA will remain  in effect (meaning this test can be used) for the duration of the  Covid-19 declaration under Section 564(b)(1) of the Act, 21  U.S.C. section 360bbb-3(b)(1), unless the authorization is  terminated or revoked. Performed at [facility], [address] [place], [place] [address]     Labs: CBC: Recent Labs  Lab 10/21/23 1938 10/22/23 0528 10/23/23 0418  WBC 5.2 4.6 10.2  HGB 11.5* 11.0* 12.1  HCT 36.5 35.1* 38.7  MCV 76.4* 75.6* 76.0*  PLT 328 294 366   Basic Metabolic Panel: Recent Labs  Lab 10/21/23 1938 10/22/23 0528 10/23/23 0418  NA 136 137 135  K 3.8 3.6 3.9  CL 103 105 103  CO2 21* 22 21*  GLUCOSE 80 90 99  BUN 15  14 13  CREATININE 0.69 0.72 0.89  CALCIUM 8.9 8.4* 8.7*   Liver Function Tests: Recent Labs  Lab 10/21/23 1938 10/22/23 0528 10/23/23 0418  AST 582* 386* 159*  ALT 674* 571* 423*  ALKPHOS 211* 189* 188*  BILITOT 3.9* 4.1* 1.4*  PROT  7.6 6.9 7.8  ALBUMIN 3.6 3.2* 3.3*   CBG: No results for input(s): GLUCAP in the last 168 hours.  Discharge time spent: greater than 30 minutes.  Signed: Laree Lock, MD [facility] 10/23/2023

## 2023-10-23 NOTE — Progress Notes (Signed)
 Mobility Specialist - Progress Note   10/23/23 1300  Mobility  Activity Ambulated with assistance;Stood at bedside  Level of Assistance Standby assist, set-up cues, supervision of patient - no hands on  Assistive Device Front wheel walker  Distance Ambulated (ft) 60 ft  Range of Motion/Exercises All extremities  Activity Response Tolerated well  Mobility visit 1 Mobility  Mobility Specialist Start Time (ACUTE ONLY) 1141  Mobility Specialist Stop Time (ACUTE ONLY) 1200  Mobility Specialist Time Calculation (min) (ACUTE ONLY) 19 min   Pt was supine in bed on RA upon entry. Pt agreed to mobility. Pt was connected to IV. Pt is able to get to the EOB independently with no AD. Pt is able to STS independently with 2WW. Pt ambulated well and had no visual signs of fatigue. Vitals were checked throughout activity. After activity pt returned to the room in bed with guest in room and needs in reach.  Clem Rodes Mobility Specialist 10/23/23, 1:08 PM

## 2023-10-24 LAB — SURGICAL PATHOLOGY

## 2023-10-31 NOTE — Anesthesia Postprocedure Evaluation (Signed)
 Anesthesia Post Note  Patient: Sonya Knapp  Procedure(s) Performed: CHOLECYSTECTOMY, ROBOT-ASSISTED, LAPAROSCOPIC  Patient location during evaluation: PACU Anesthesia Type: General Level of consciousness: awake and alert Pain management: pain level controlled Vital Signs Assessment: post-procedure vital signs reviewed and stable Respiratory status: spontaneous breathing, nonlabored ventilation, respiratory function stable and patient connected to nasal cannula oxygen Cardiovascular status: blood pressure returned to baseline and stable Postop Assessment: no apparent nausea or vomiting Anesthetic complications: no   No notable events documented.   Last Vitals:  Vitals:   10/23/23 0320 10/23/23 0815  BP: 112/67 101/65  Pulse: 86 71  Resp: 18 18  Temp: 36.7 C 37.4 C  SpO2: 98% 100%    Last Pain:  Vitals:   10/23/23 1032  TempSrc:   PainSc: 6                  Prentice Murphy

## 2024-02-18 ENCOUNTER — Ambulatory Visit: Admitting: Family Medicine
